# Patient Record
Sex: Female | Born: 1965 | Race: White | Hispanic: No | Marital: Married | Smoking: Former smoker
Health system: Southern US, Academic
[De-identification: ages and names within clinical notes are randomized; demographics above are authoritative.]

## PROBLEM LIST (undated history)

## (undated) DIAGNOSIS — E079 Disorder of thyroid, unspecified: Secondary | ICD-10-CM

## (undated) DIAGNOSIS — F411 Generalized anxiety disorder: Secondary | ICD-10-CM

## (undated) DIAGNOSIS — F32A Depression, unspecified: Secondary | ICD-10-CM

## (undated) DIAGNOSIS — Z8619 Personal history of other infectious and parasitic diseases: Secondary | ICD-10-CM

## (undated) HISTORY — PX: HX TONSILLECTOMY: SHX27

## (undated) HISTORY — DX: Depression, unspecified: F32.A

## (undated) HISTORY — DX: Generalized anxiety disorder: F41.1

## (undated) HISTORY — DX: Personal history of other infectious and parasitic diseases: Z86.19

## (undated) HISTORY — DX: Disorder of thyroid, unspecified: E07.9

---

## 1993-02-25 ENCOUNTER — Other Ambulatory Visit (HOSPITAL_COMMUNITY): Payer: Self-pay

## 2019-10-10 IMAGING — MG 3D SCREENING MAMMO BIL W/CAD
5 series · 9 of 24 positions shown · non-contrast
Comparison: 02/24/2020 and 06/17/2014.

------------- REPORT [id number] -------------
[facility]
[address]
Daina Ms.MOLIMA, SRT FERNANDA:
We wish to report the following on your recent mammography examination. We are sending a report to your referring physician or other health care provider. 
(       Normal/Negative:
No evidence of cancer.
This statement is mandated by the [place], Department of Health.
Your examination was performed by one of our technologists, who are registered radiological technologists and also specially certified in mammography:
___
Parlak, Edaly (M)
Nepomuceno, Martinez (M)

Your mammogram was interpreted by our radiologist.
( 
Sofeine Made, M.D.
(Annual Breast Examination by a physician or other health care provider
(Annual Mammography Screening beginning at age 40
(Monthly Breast Self Examination
------------- REPORT [id number] -------------
EXAM:  3D BILATERAL ANNUAL SCREENING DIGITAL MAMMOGRAM WITH TOMOSYNTHESIS AND CAD
INDICATION: Screening.

[R CC · right · 0.10mm/px · 2 of 2 slices shown]
[im 1/2]
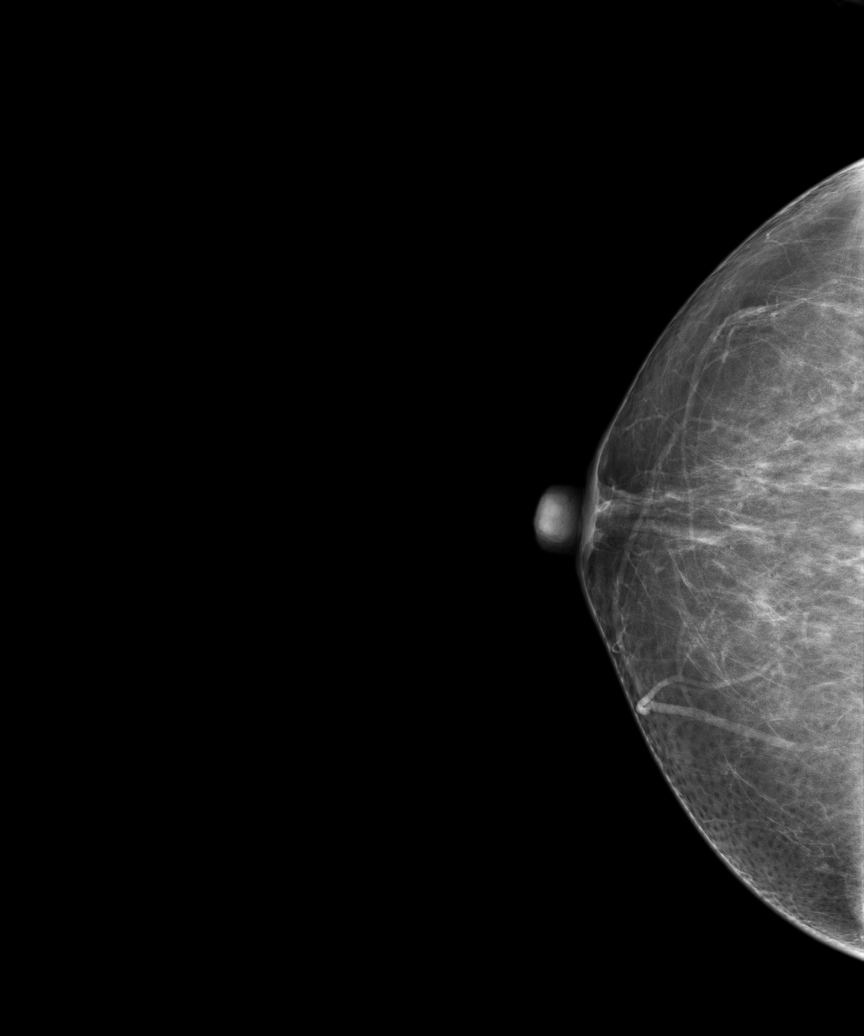
[im 2/2]
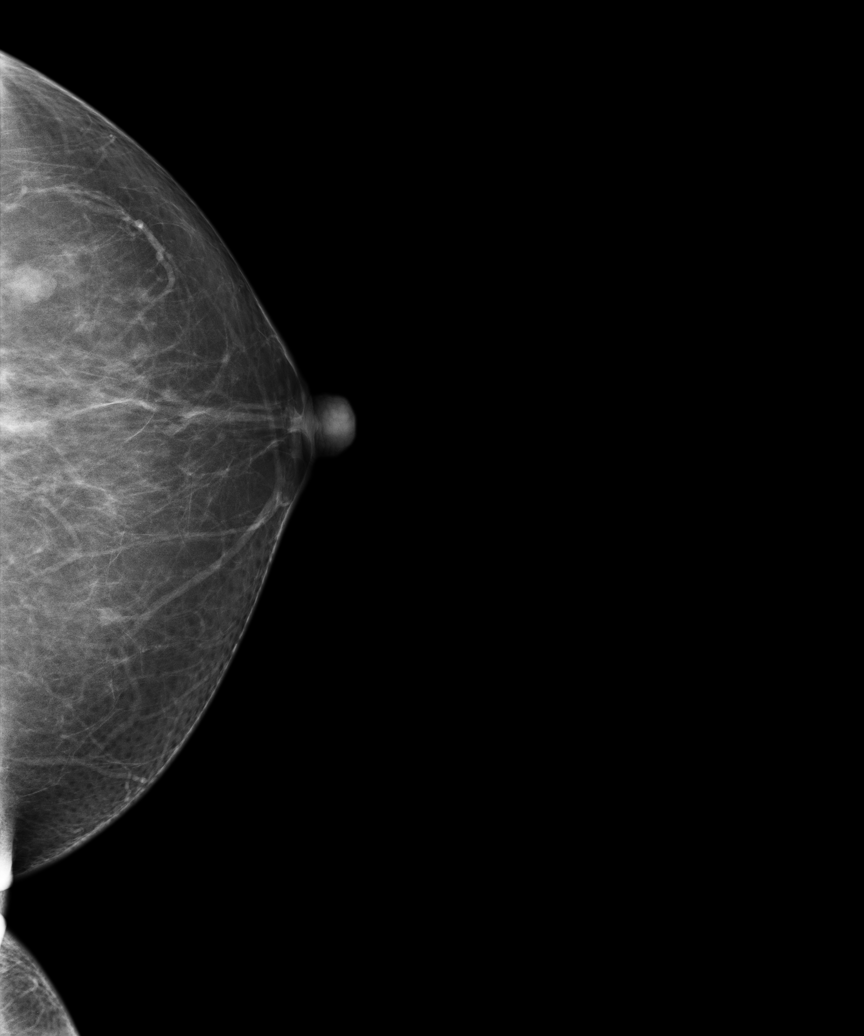

[3D SCREENING MAMMO BIL W/CAD · 2 acquisitions, 4 frames shown (1 of 2)]
[im 1/2]
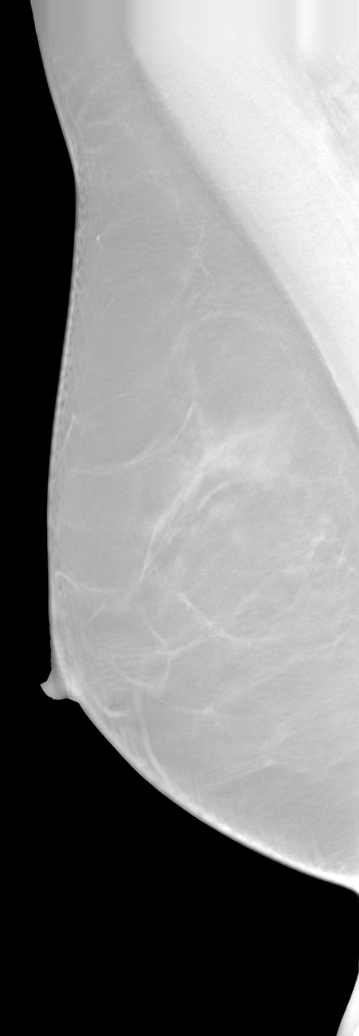
[im 1/2]
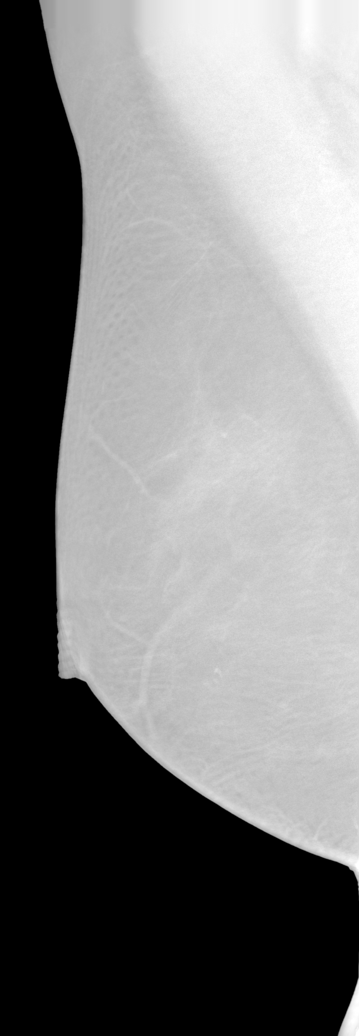
[im 2/2]
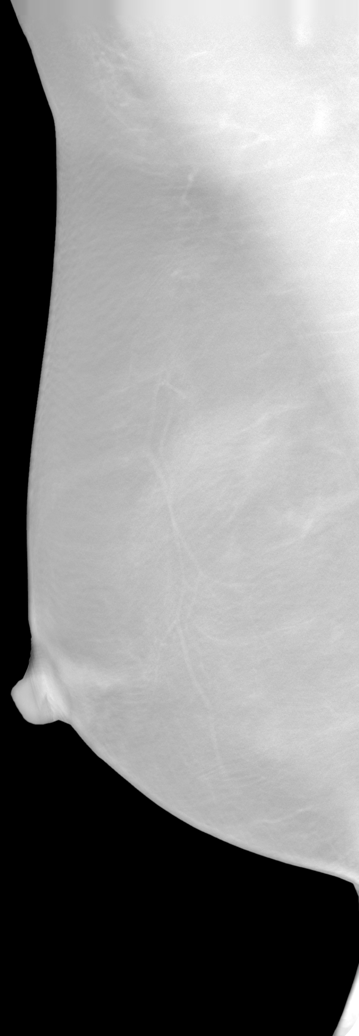
[im 2/2]
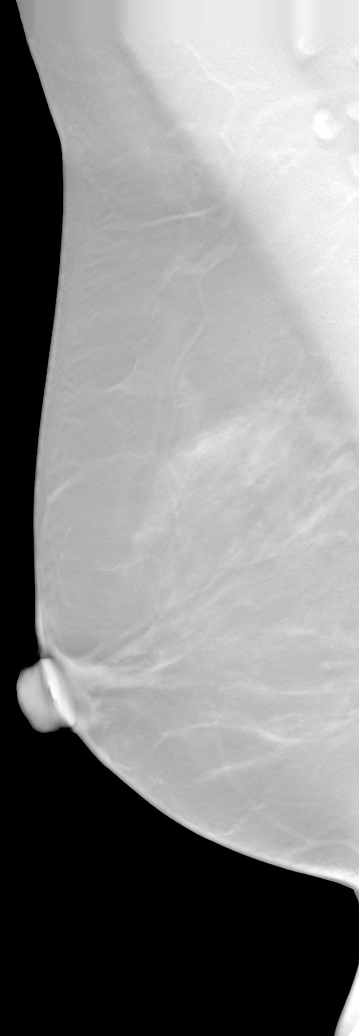

[3D SCREENING MAMMO BIL W/CAD (2 of 2) · tomo slice 13/80.0]
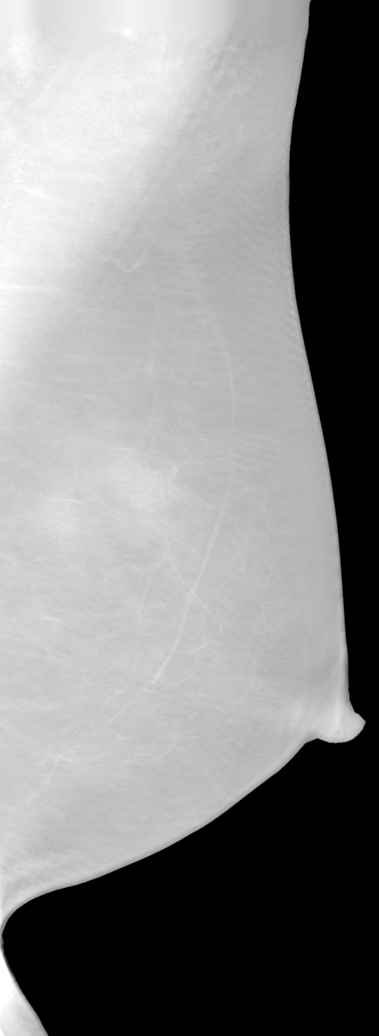

[R]
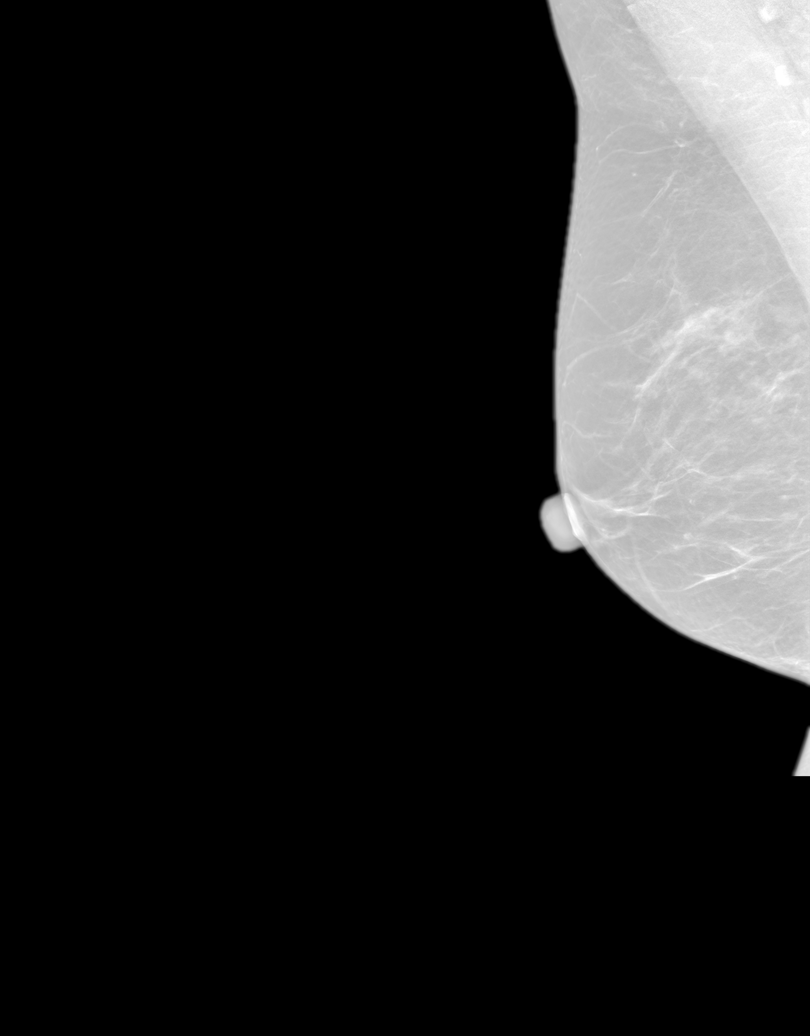

[L]
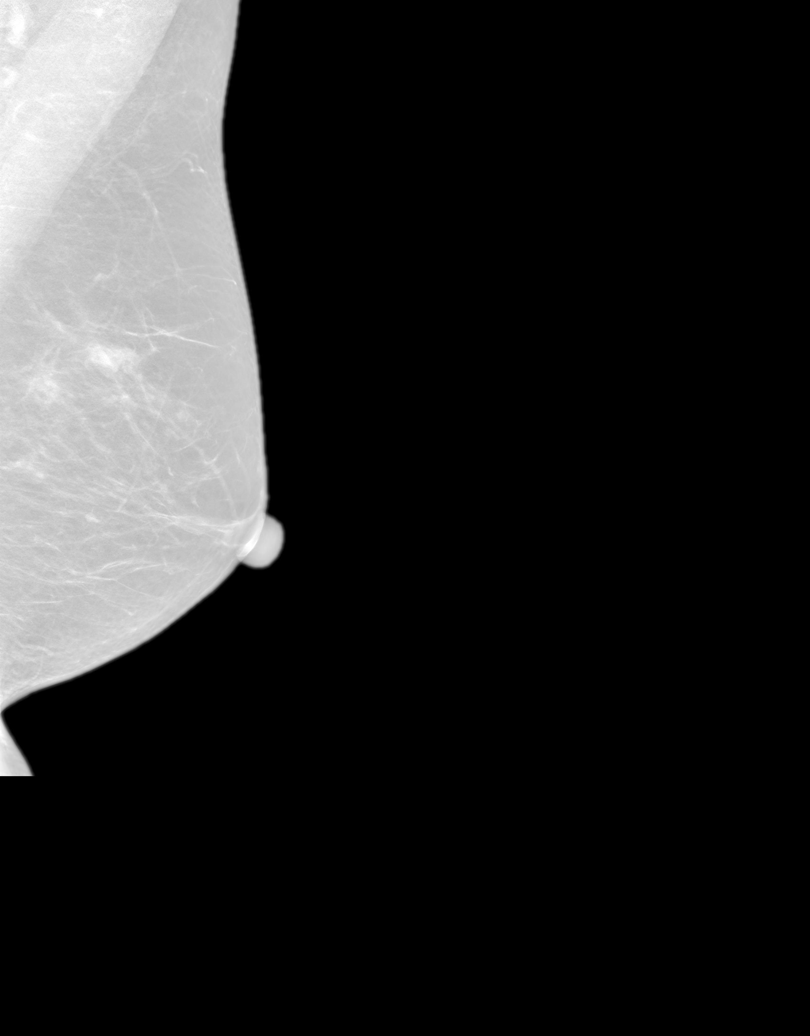

[9 of 24 positions shown; findings below may reference images not displayed]

FINDINGS: There are scattered fibroglandular elements.  There is no mass or suspicious cluster of microcalcifications.   There is no architectural distortion, skin thickening or nipple retraction.
IMPRESSION: 1.  BIRADS 2-Benign findings. Patient has been added in a reminder system with a target date for the next screening mammography.

2.  DENSITY CODE –  B (Scattered areas of fibroglandular density).

Final Assessment Code:

Bi-Rads 2 

BI-RADS 0
Need additional imaging evaluation

BI-RADS 1
Negative mammogram

BI-RADS 2
Benign finding

BI-RADS 3
Probably benign finding; short-interval follow-up suggested

BI-RADS 4
Suspicious abnormality; biopsy should be considered

BI-RADS 5
Highly suggestive of malignancy; appropriate action should be taken

BI-RADS 6
Known biopsy-proven malignancy; appropriate action should be taken

NOTE:
In compliance with Federal regulations, the results of this mammogram are being sent to the patient.

## 2020-05-30 IMAGING — CT CT LUMBAR SPINE WITHOUT CONTRAST
3 of 8 series · 15 of 33 positions shown, 18 images · non-contrast
Comparison: None available.

EXAM:  CT LUMBAR SPINE WITHOUT CONTRAST
INDICATION: Right-sided lower back pain.
TECHNIQUE: Axial noncontrast CT imaging of the lumbar spine was performed. Images were reviewed in multiple windows and projections. Exam was performed using 1 or more of the following dose reduction techniques: Automated exposure control, adjustment of the mA and/or kV according to patient size, or the use of iterative reconstruction technique.

[ax bone · axial · 0.31mm/px · z∈[-1089,-891]mm · 7 of 133 slices shown, 9 images]
[im 17/133  soft-tissue]
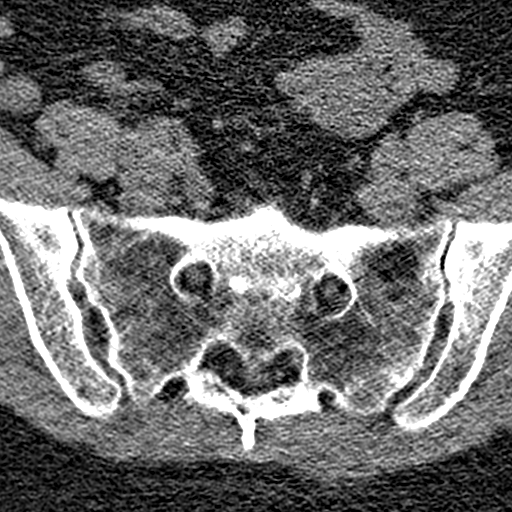
[im 17/133  bone]
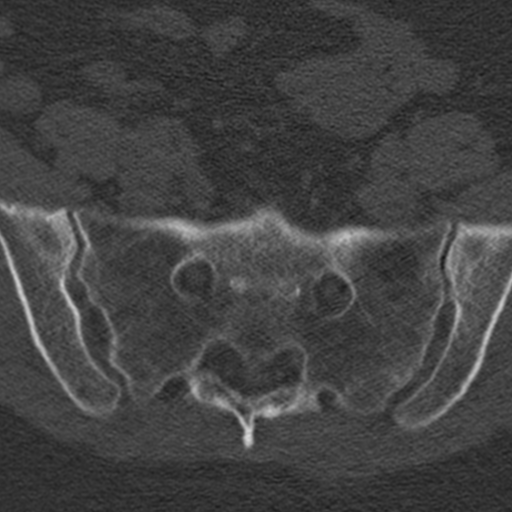
[im 34/133  bone]
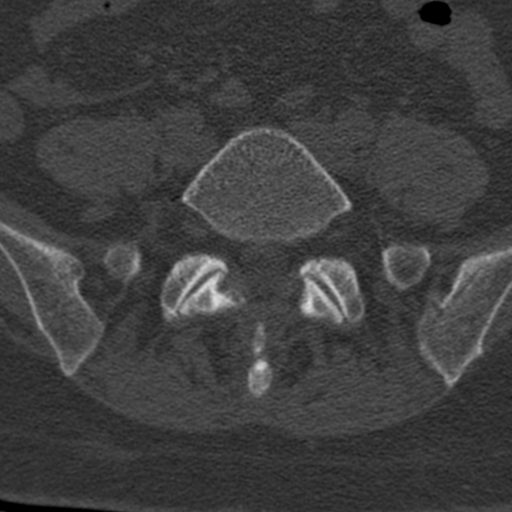
[im 50/133  bone]
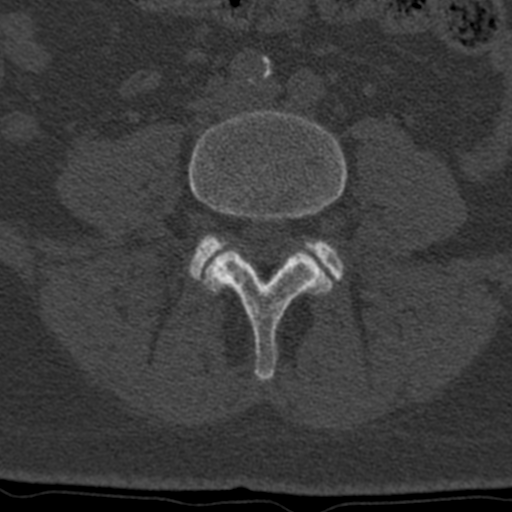
[im 67/133  bone]
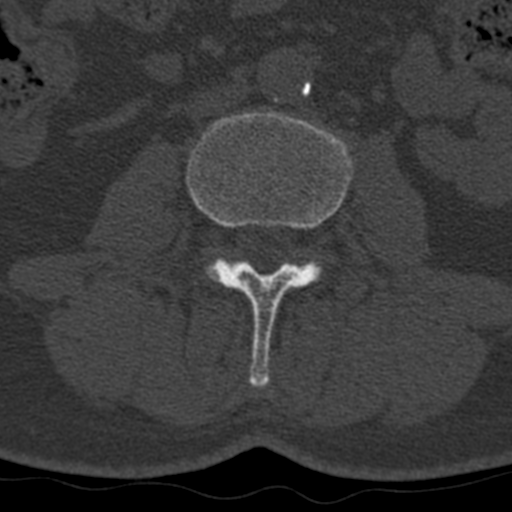
[im 83/133  soft-tissue]
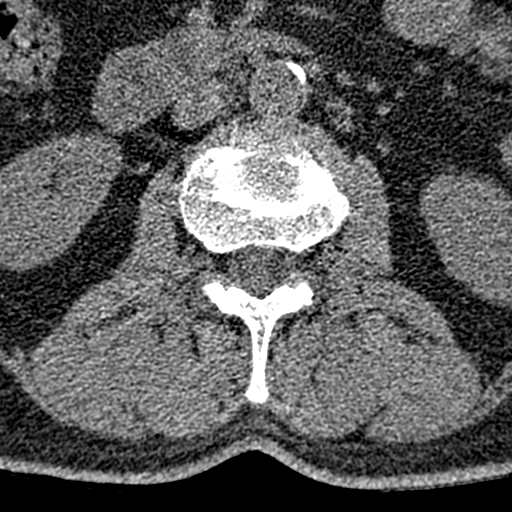
[im 83/133  bone]
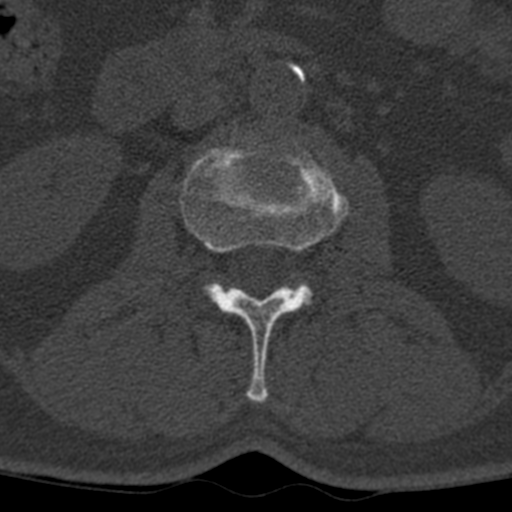
[im 100/133  bone]
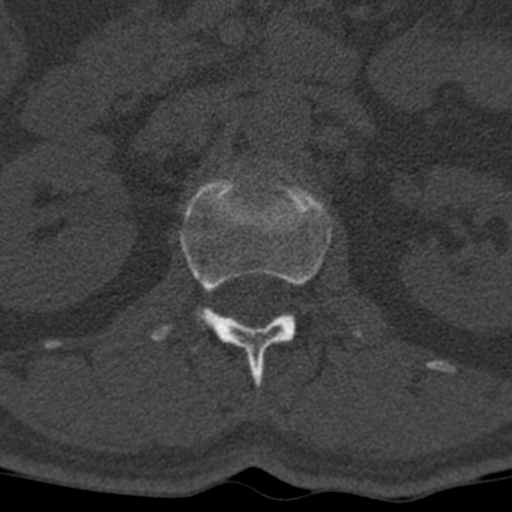
[im 116/133  bone]
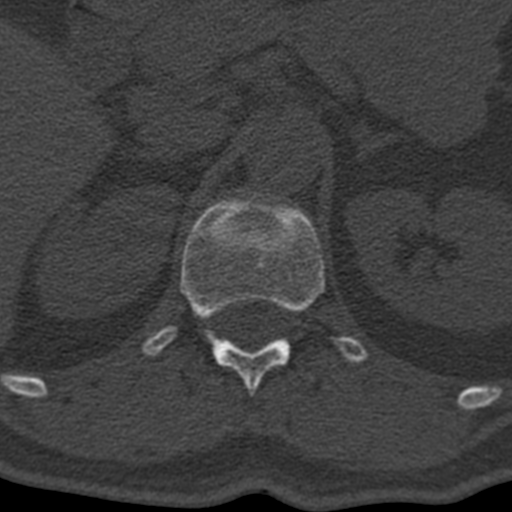

[sag bone · sagittal · 0.45mm/px · 5 of 46 slices shown, 6 images]
[im 16/46  bone]
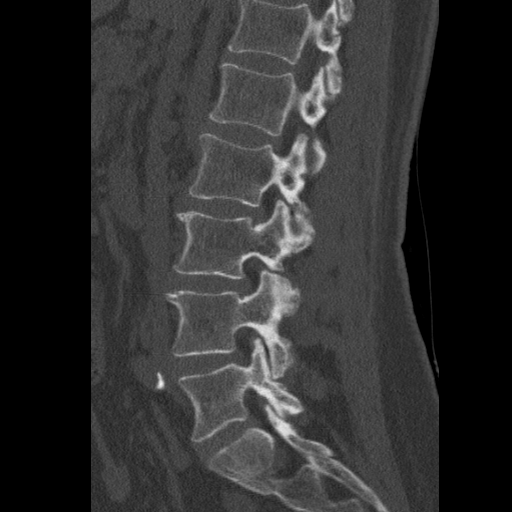
[im 19/46  bone]
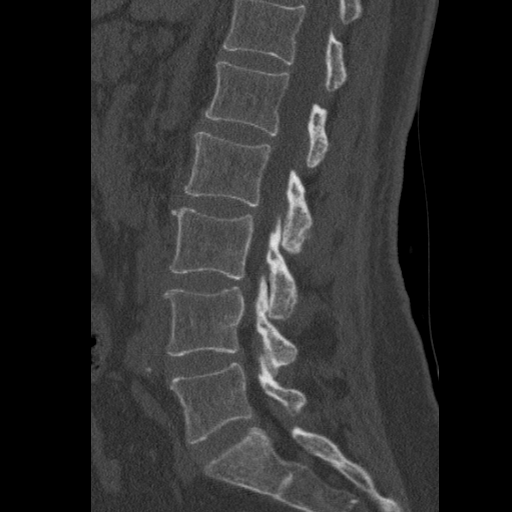
[im 23/46  soft-tissue]
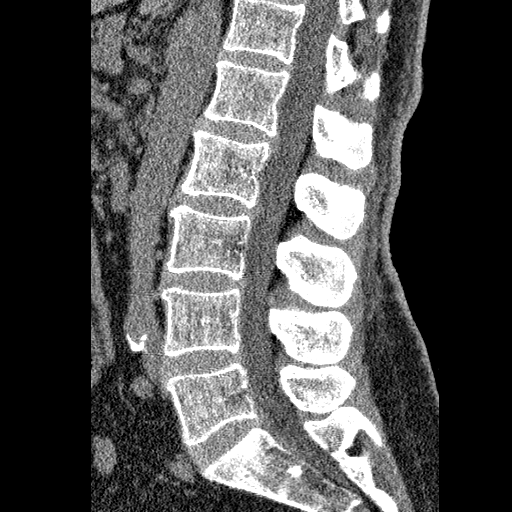
[im 23/46  bone]
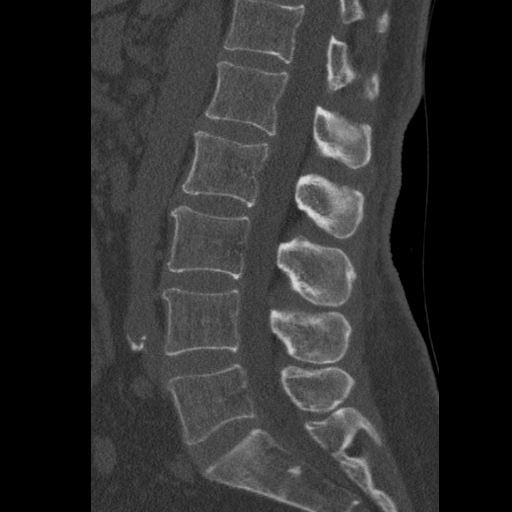
[im 27/46  bone]
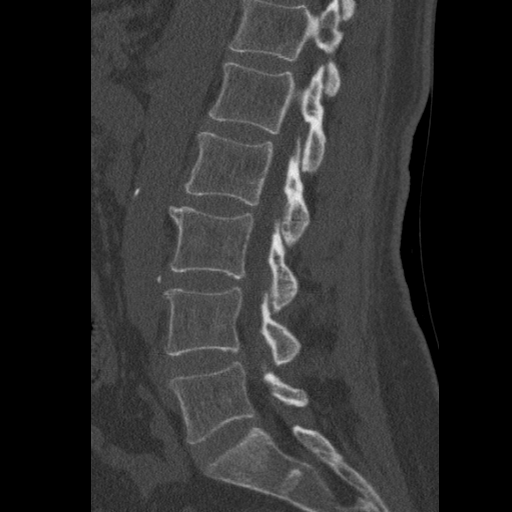
[im 31/46  bone]
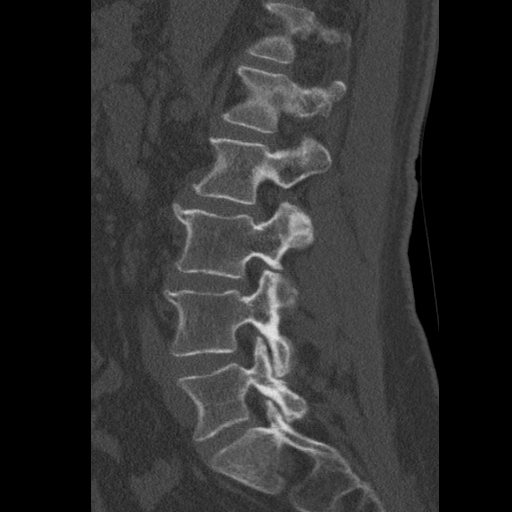

[cor bone · coronal · 0.45mm/px · 3 of 61 slices shown]
[im 13/61  bone]
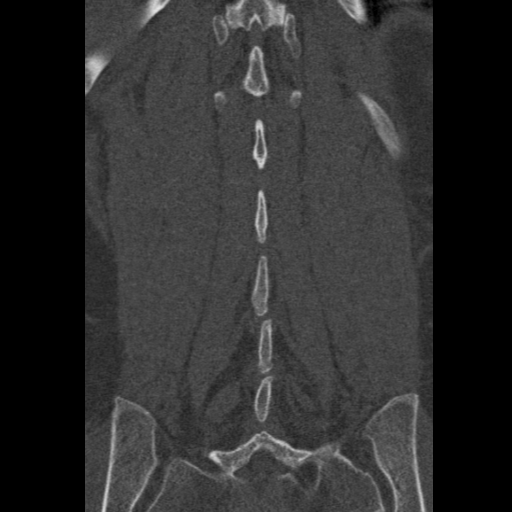
[im 25/61  bone]
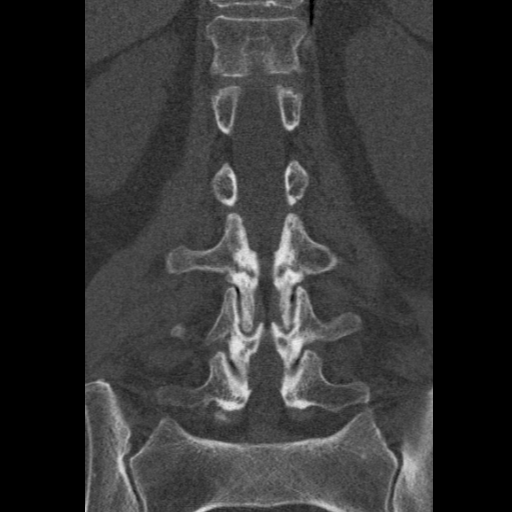
[im 37/61  bone]
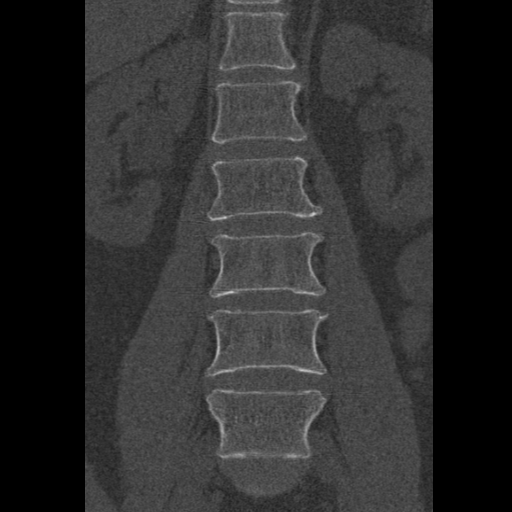

[15 of 33 positions shown; findings below may reference images not displayed]

FINDINGS: Vertebral bodies are normal in height and alignment. There is no acute fracture or subluxation. Disc spaces are also fairly well maintained at all levels.

At L1-2 level, there is a small broad-based central disc bulge, mildly effacing the ventral thecal sac. There is no significant neural foraminal stenosis.

At L2-3 level, there is a small broad-based central disc bulge, mildly effacing the ventral thecal sac. There is no significant neural foraminal stenosis.

At L3-4 level, there is a minimal bulging annulus, minimally effacing the ventral thecal sac. There is mild bilateral neural foraminal stenosis from bulging annulus without nerve root impingement.

At L4-5 level, there is a small broad-based central disc bulge with associated ligamentous hypertrophy resulting in mild-to-moderate spinal stenosis. There is mild bilateral neural foraminal stenosis from facet arthropathy and bulging annulus without nerve root impingement.

At L5-S1 level, there is a small broad-based central disc bulge, minimally abutting the ventral thecal sac. There is no significant neural foraminal stenosis.

Paraspinal soft tissues are unremarkable. There are scattered vascular calcifications.
IMPRESSION: Small disc bulges at most levels with mild-to-moderate spinal stenosis at L4-5 level. 

Multilevel neural foraminal stenosis as detailed above.

## 2020-12-25 IMAGING — MG 3D SCREENING MAMMO BIL W/CAD & TOMO
5 series · 9 of 24 positions shown · non-contrast
Comparison: 02/06/2021

------------- REPORT [id number] -------------
﻿

                         #:
EXAM:  3D BILATERAL ANNUAL SCREENING DIGITAL MAMMOGRAM WITH CAD AND TOMOSYNTHESIS
INDICATION: Screening.

[L]
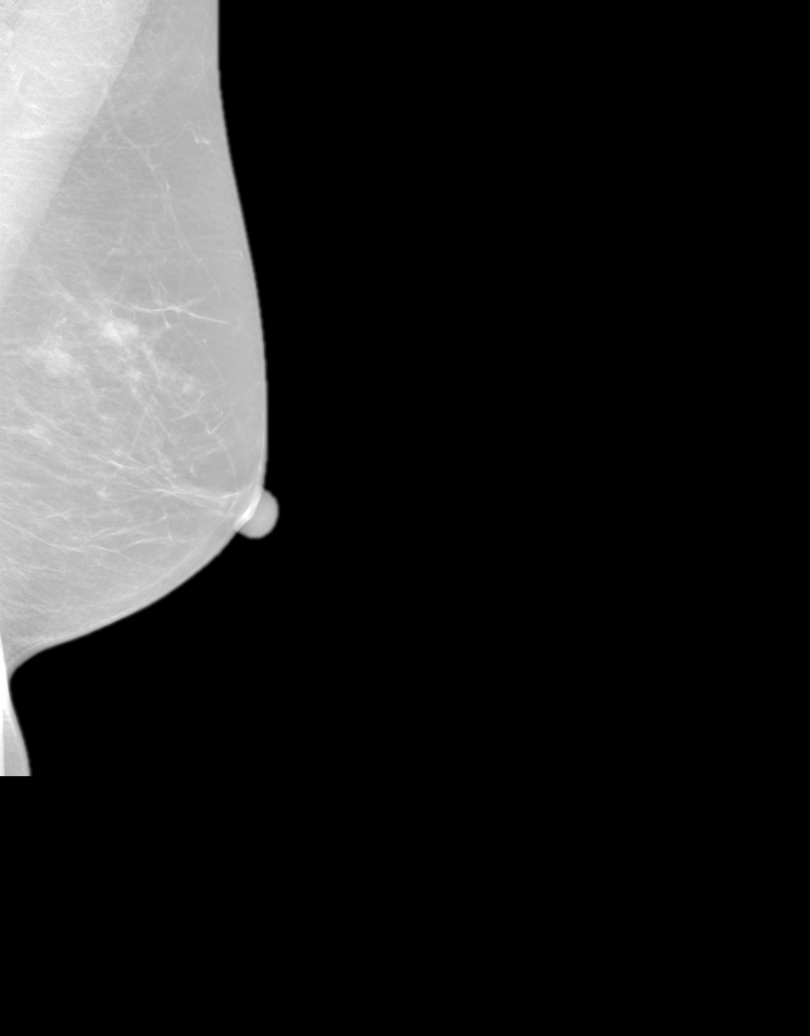

[R]
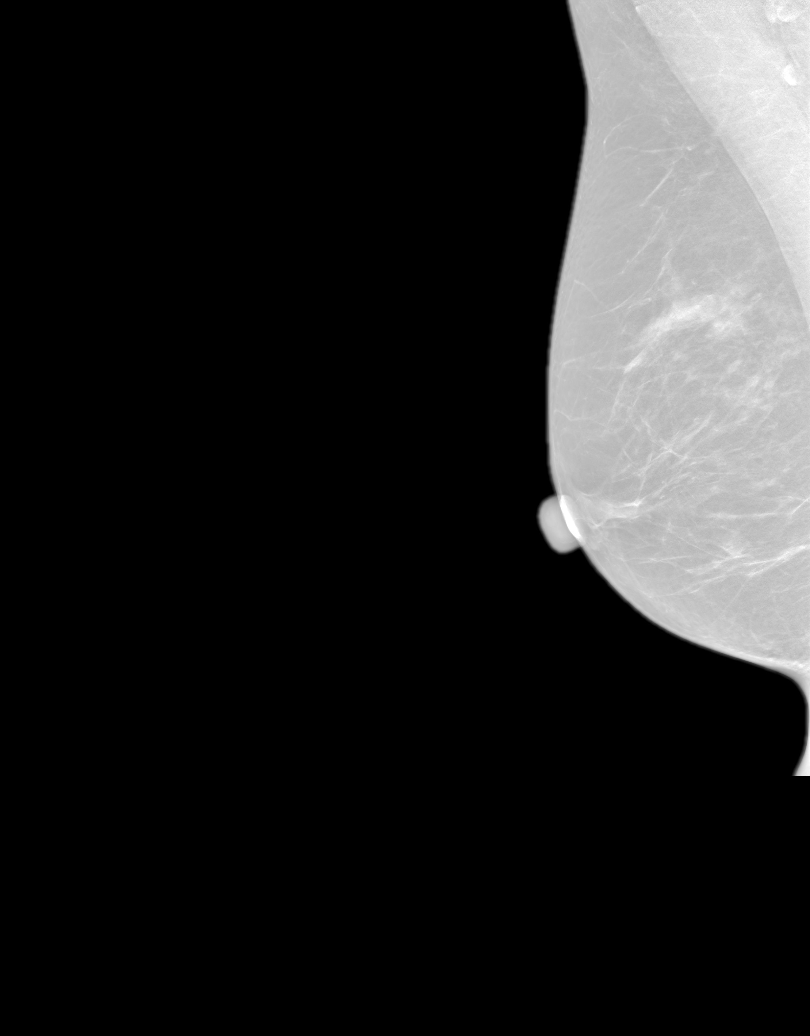

[Series 2921: R CC tomo · right · 2 of 3 slices shown]
[im 1/3]
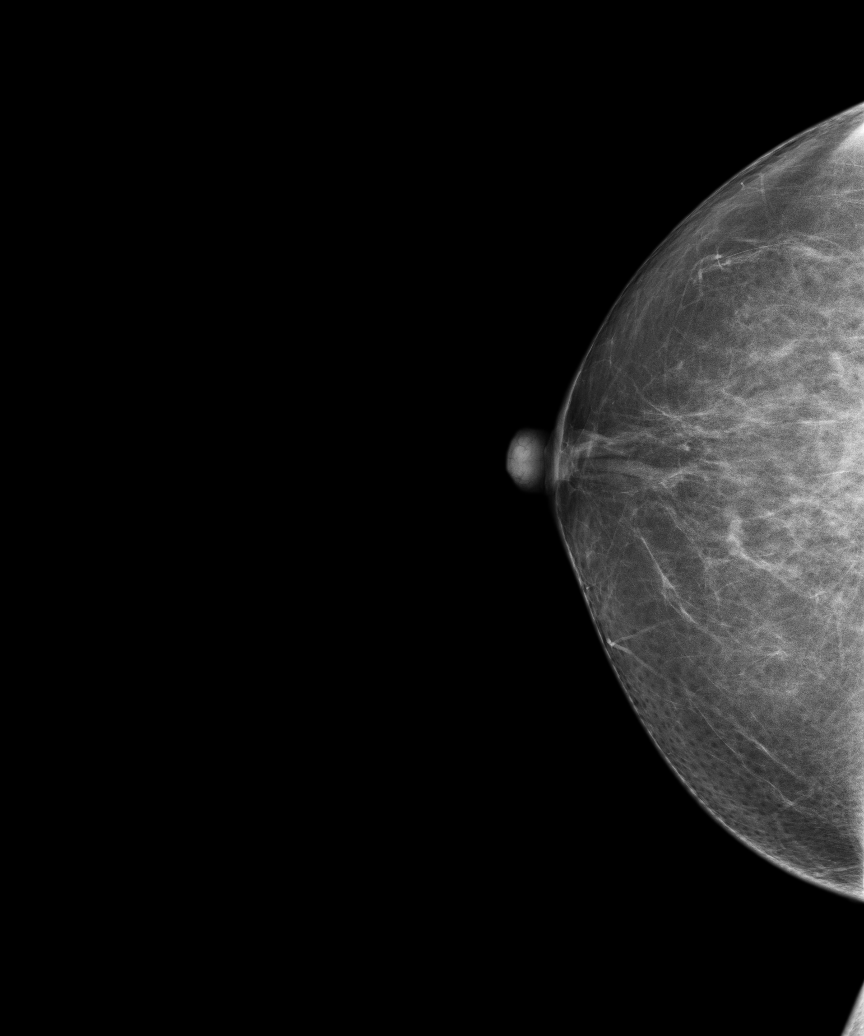
[im 3/3]
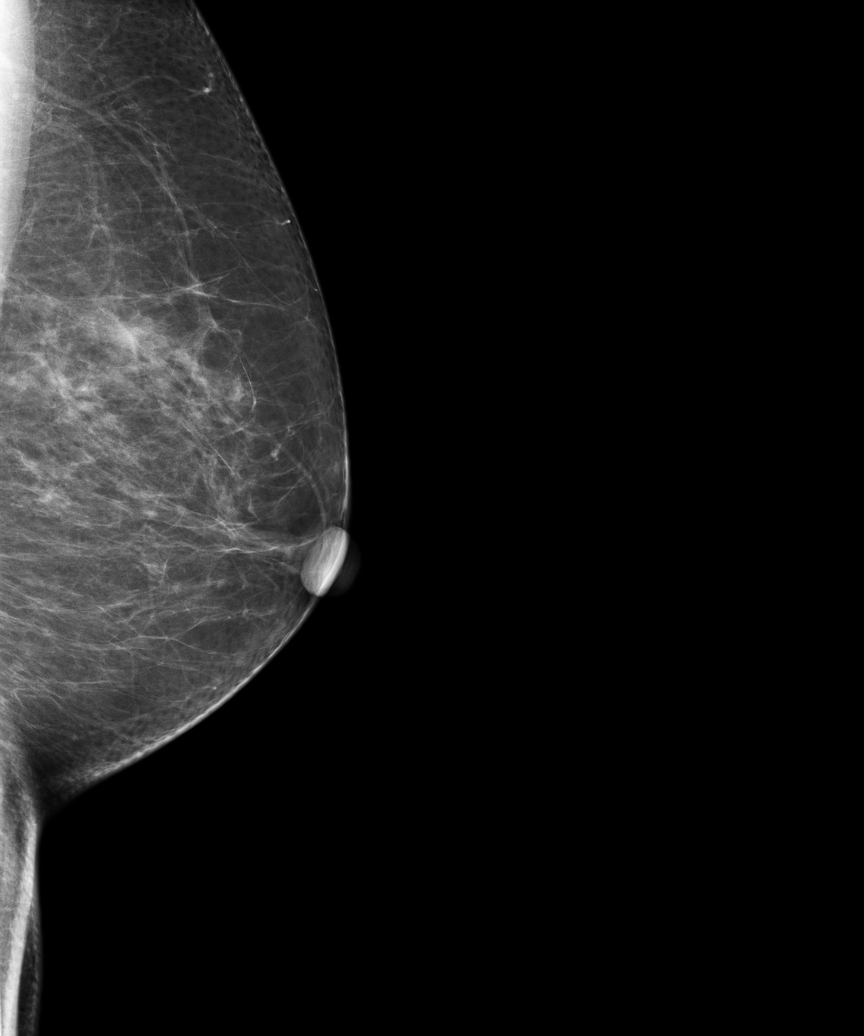

[Series 2923: 3D SCREENING MAMMO BIL W/CAD & TOMO tomo · 2 acquisitions, 4 frames shown (1 of 2)]
[im 1/2]
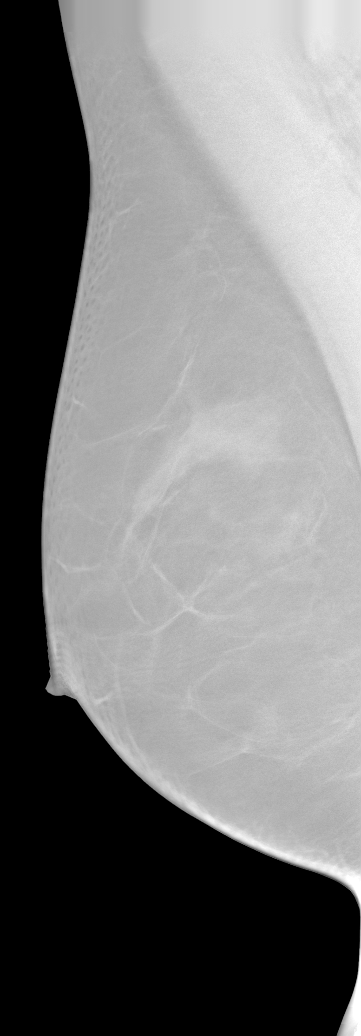
[im 1/2]
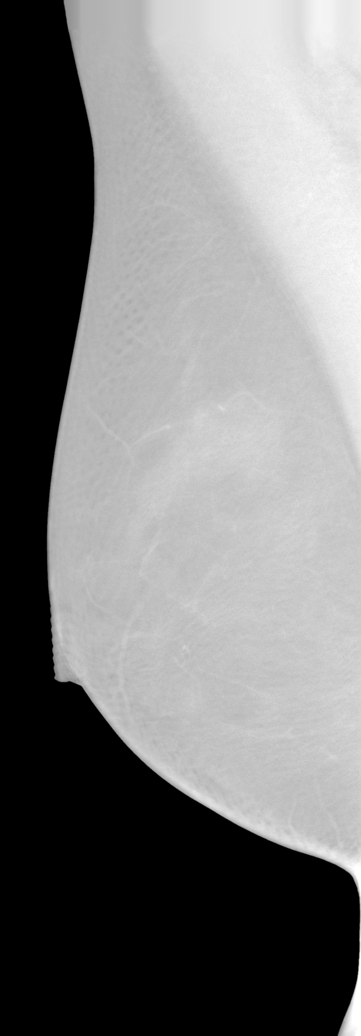
[im 2/2]
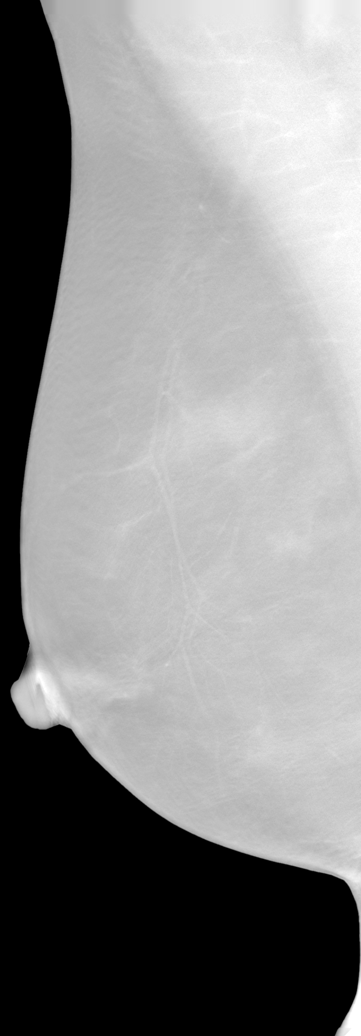
[im 2/2]
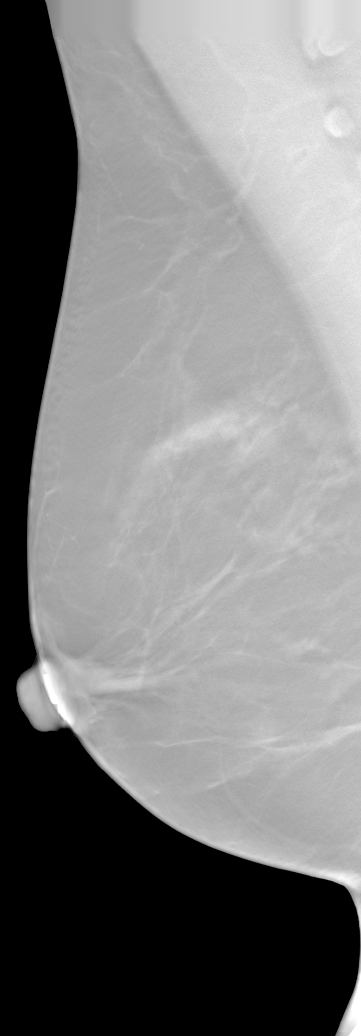

[3D SCREENING MAMMO BIL W/CAD & TOMO tomo (2 of 2) · tomo slice 13/80.0]
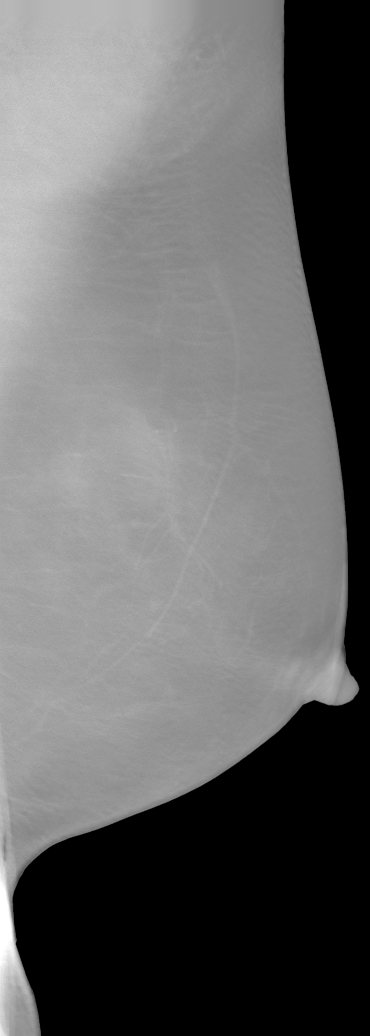

[9 of 24 positions shown; findings below may reference images not displayed]

FINDINGS: There are scattered fibroglandular elements.  There is no mass or suspicious cluster of microcalcifications.   There is no architectural distortion, skin thickening or nipple retraction.
IMPRESSION: 1.  BIRADS 2-Benign findings. Patient has been added in a reminder system with a target date for the next screening mammography.

2.  DENSITY CODE – B (Scattered areas of fibroglandular density). 

Final Assessment Code:

Bi-Rads 2 

BI-RADS 0
Need additional imaging evaluation.

BI-RADS 1
Negative mammogram.

BI-RADS 2
Benign finding.

BI-RADS 3
Probably benign finding; short-interval follow-up suggested.

BI-RADS 4
Suspicious abnormality; biopsy should be considered.

BI-RADS 5
Highly suggestive of malignancy; appropriate action should be taken.

BI-RADS 6
Known biopsy-proven malignancy; appropriate action should be taken.

NOTE:
In compliance with Federal regulations, the results of this mammogram are being sent to the patient.

------------- REPORT [id number] -------------
[facility]
[address]
Tiger Ms.MADU, TAWANG:
We wish to report the following on your recent mammography examination. We are sending a report to your referring physician or other health care provider. 
(       Normal/Negative:
No evidence of cancer.
This statement is mandated by the [place], Department of Health.
Your examination was performed by one of our technologists, who are registered radiological technologists and also specially certified in mammography:
___
Markland, Marjuan (M)

Your mammogram was interpreted by our radiologist.

( 
Collette Sedman, M.D.

(Annual Breast Examination by a physician or other health care provider
(Annual Mammography Screening beginning at age 40
(Monthly Breast Self Examination

## 2022-01-01 IMAGING — MG 3D SCREENING MAMMO BIL W/CAD & TOMO
5 series · 8 of 24 positions shown · non-contrast
Comparison: 04/16/2021 and 01/30/2020.

------------- REPORT [id number] -------------
[facility]
[address]
Tiger Ms.MADU, TAWANG:
We wish to report the following on your recent mammography examination. We are sending a report to your referring physician or other health care provider. 
(       Normal/Negative:
No evidence of cancer.
This statement is mandated by the [place], Department of Health.
Your examination was performed by one of our technologists, who are registered radiological technologists and also specially certified in mammography:
___
Markland, Marjuan (M)

Your mammogram was interpreted by our radiologist.
( 
Collette Sedman, M.D.
(Annual Breast Examination by a physician or other health care provider
(Annual Mammography Screening beginning at age 40
(Monthly Breast Self Examination
------------- REPORT [id number] -------------
﻿
EXAM:  3D BILATERAL ANNUAL SCREENING DIGITAL MAMMOGRAM WITH CAD AND TOMOSYNTHESIS
INDICATION: Screening.

[R]
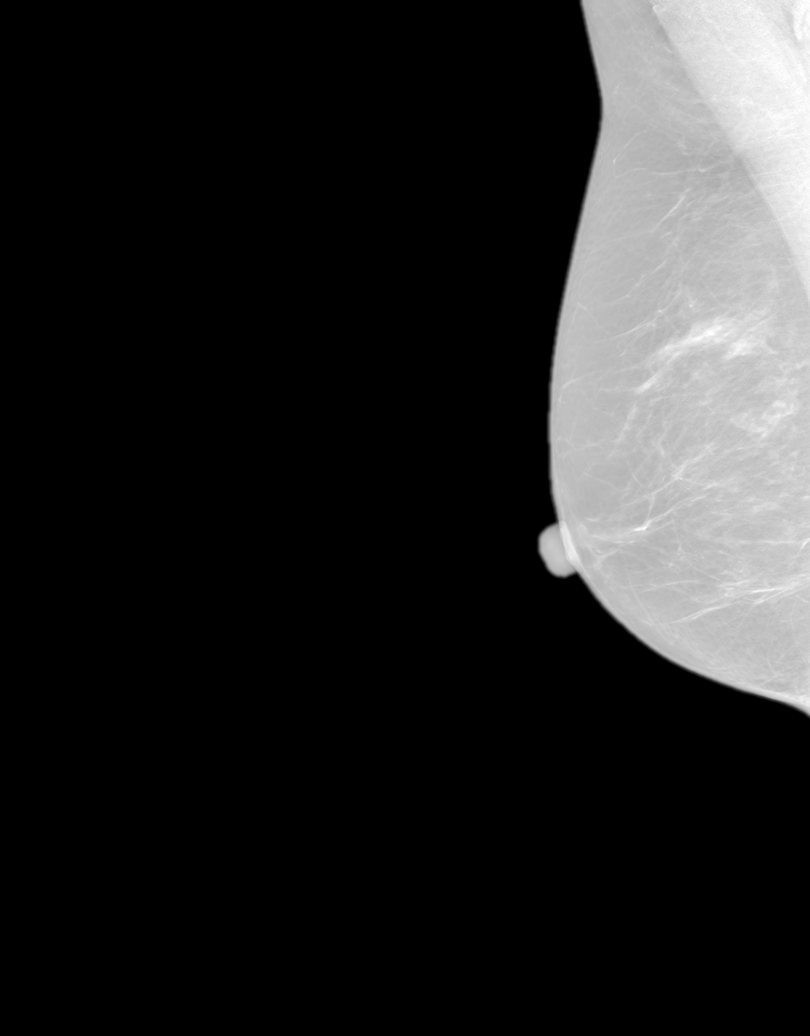

[L]
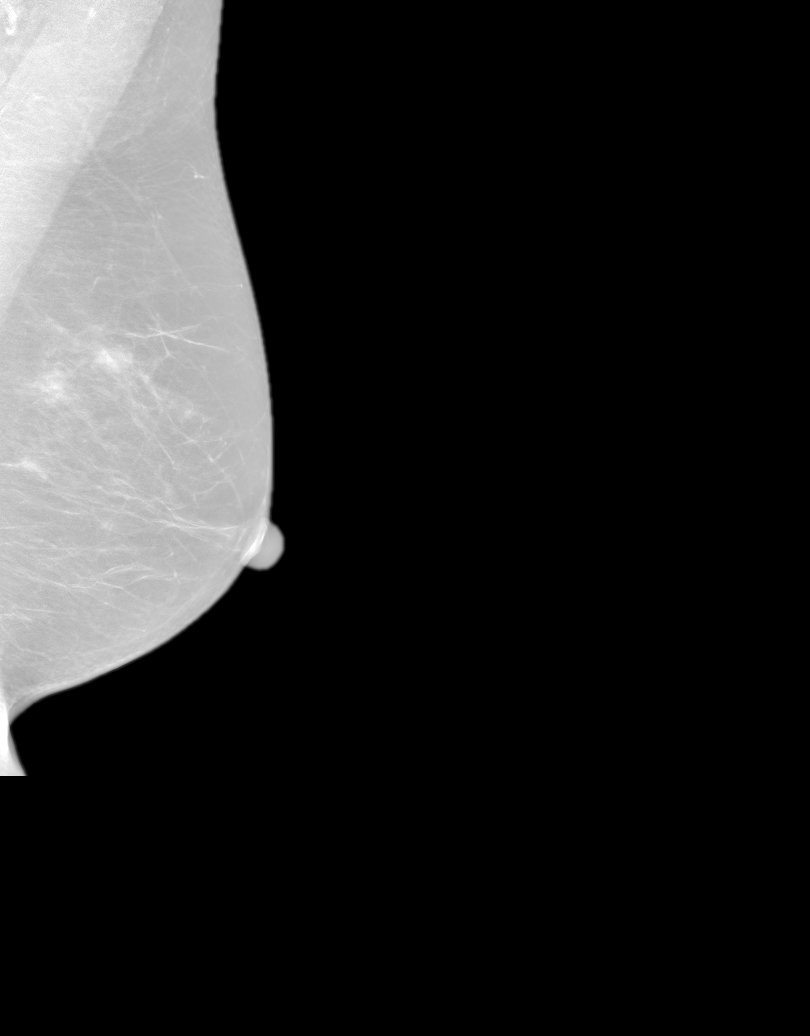

[R CC tomo · right · 0.10mm/px · 2 of 2 slices shown]
[im 1/2]
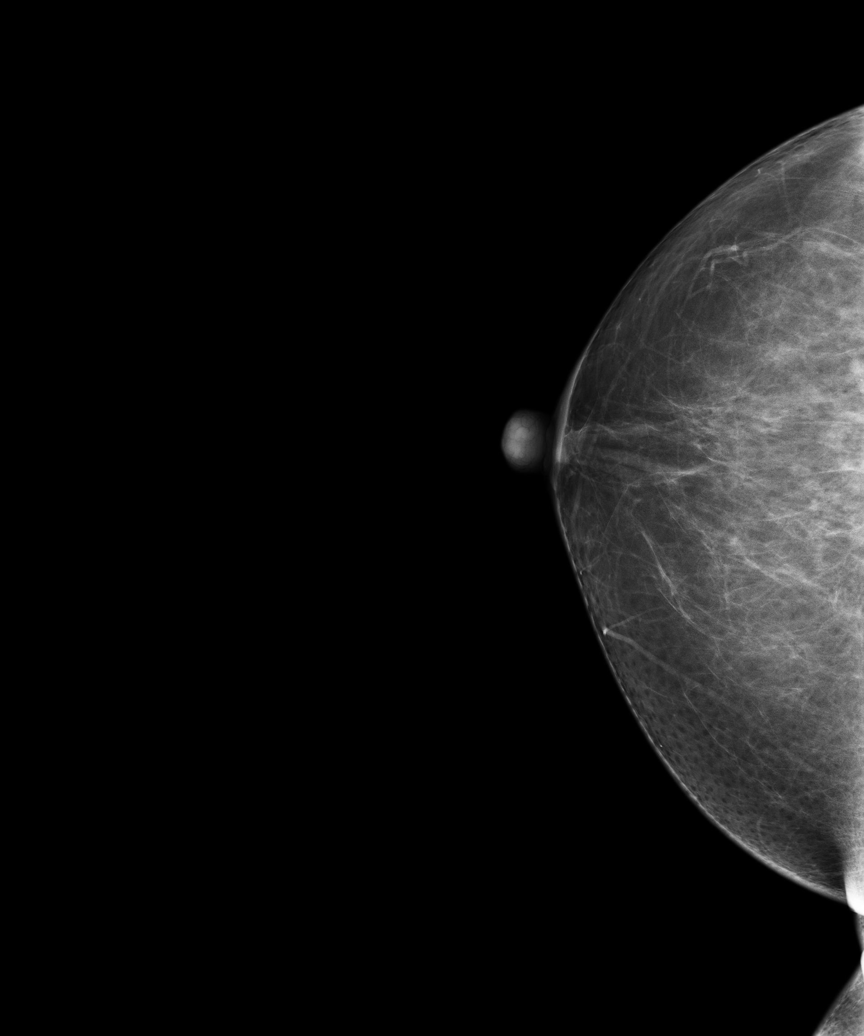
[im 2/2]
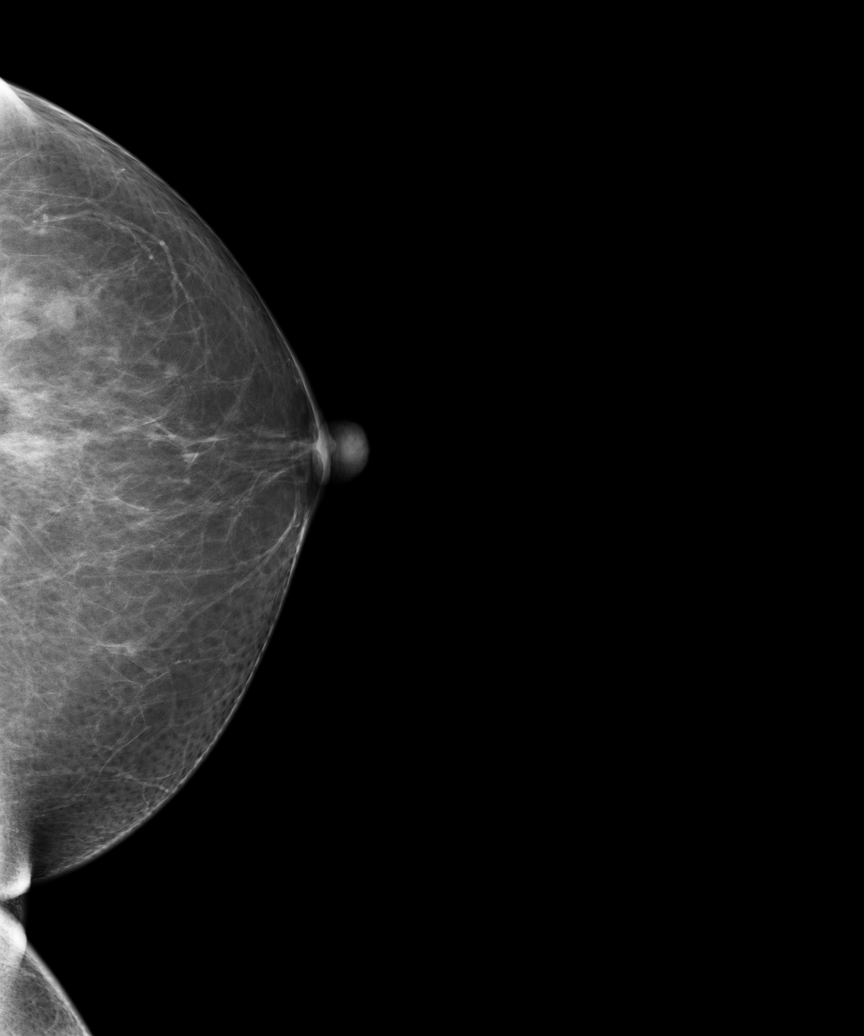

[3D SCREENING MAMMO BIL W/CAD & TOMO tomo · 2 acquisitions, 3 frames shown (1 of 2)]
[im 1/2]
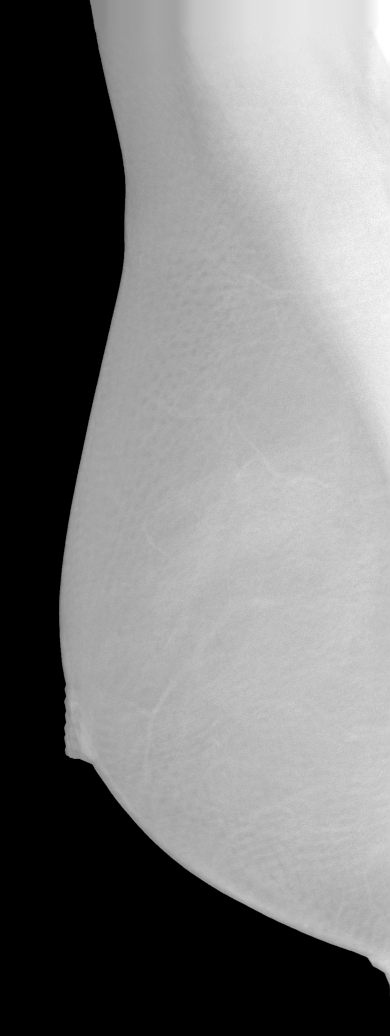
[im 2/2]
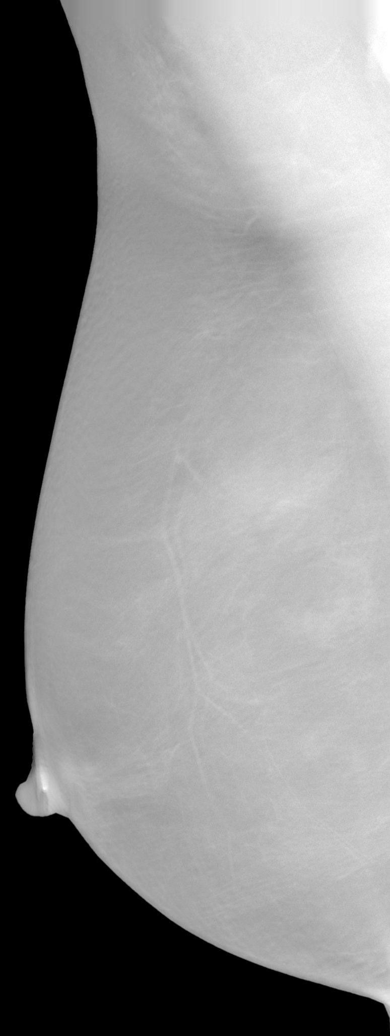
[im 2/2]
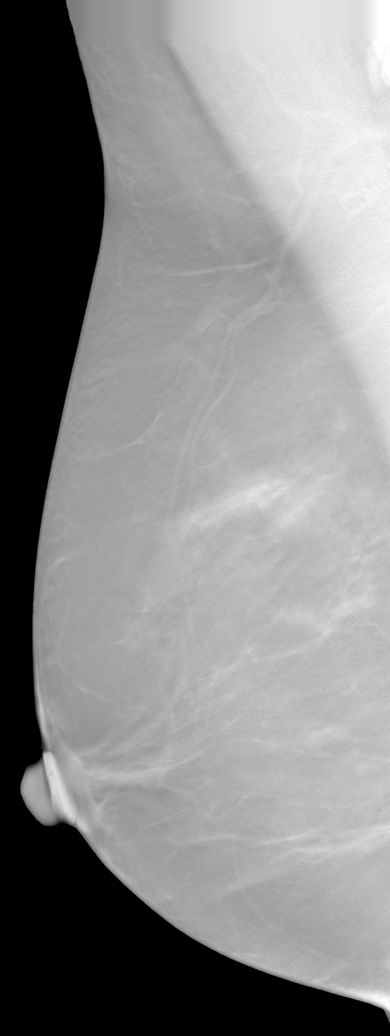

[3D SCREENING MAMMO BIL W/CAD & TOMO tomo (2 of 2) · tomo slice 13/83.0]
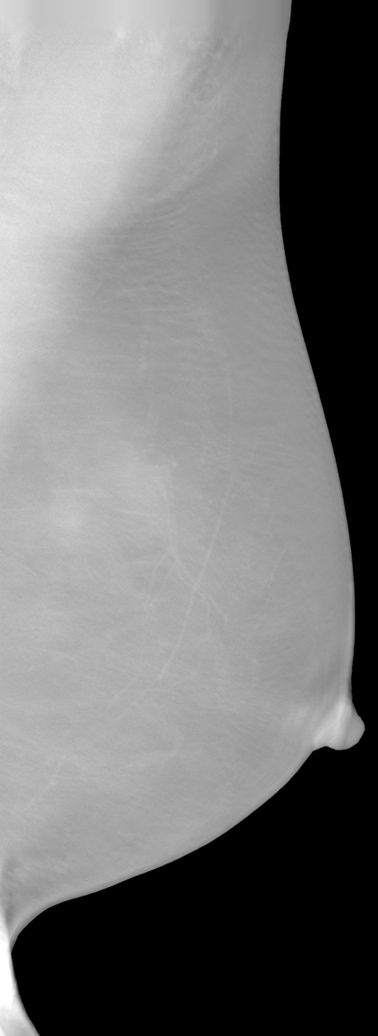

[8 of 24 positions shown; findings below may reference images not displayed]

FINDINGS: There are scattered fibroglandular elements.  There is no mass or suspicious cluster of microcalcifications.   There is no architectural distortion, skin thickening or nipple retraction.
IMPRESSION: 1.  BIRADS 2-Benign findings. Patient has been added in a reminder system with a target date for the next screening mammography.

2.  DENSITY CODE – B (Scattered areas of fibroglandular density). 

Final Assessment Code:

Bi-Rads 2 

BI-RADS 0
 Need additional imaging evaluation.

BI-RADS 1
 Negative mammogram.

BI-RADS 2
 Benign finding.

BI-RADS 3
 Probably benign finding; short-interval follow-up suggested.

BI-RADS 4
 Suspicious abnormality; biopsy should be considered.

BI-RADS 5
 Highly suggestive of malignancy; appropriate action should be taken.

BI-RADS 6
 Known biopsy-proven malignancy; appropriate action should be taken.

NOTE:
In compliance with Federal regulations, the results of this mammogram are being sent to the patient.

## 2022-04-29 ENCOUNTER — Ambulatory Visit (INDEPENDENT_AMBULATORY_CARE_PROVIDER_SITE_OTHER): Payer: Self-pay | Admitting: OBSTETRICS/GYNECOLOGY

## 2022-07-09 ENCOUNTER — Encounter (INDEPENDENT_AMBULATORY_CARE_PROVIDER_SITE_OTHER): Payer: Self-pay | Admitting: OBSTETRICS/GYNECOLOGY

## 2022-07-09 ENCOUNTER — Ambulatory Visit (INDEPENDENT_AMBULATORY_CARE_PROVIDER_SITE_OTHER): Payer: BC Managed Care – PPO | Admitting: OBSTETRICS/GYNECOLOGY

## 2022-07-09 ENCOUNTER — Other Ambulatory Visit: Payer: Self-pay

## 2022-07-09 ENCOUNTER — Other Ambulatory Visit: Payer: BC Managed Care – PPO | Attending: OBSTETRICS/GYNECOLOGY | Admitting: OBSTETRICS/GYNECOLOGY

## 2022-07-09 VITALS — BP 162/86 | HR 80 | Ht 66.0 in | Wt 169.0 lb

## 2022-07-09 DIAGNOSIS — Z01419 Encounter for gynecological examination (general) (routine) without abnormal findings: Secondary | ICD-10-CM

## 2022-07-09 DIAGNOSIS — Z79890 Hormone replacement therapy (postmenopausal): Secondary | ICD-10-CM

## 2022-07-09 MED ORDER — PROGESTERONE MICRONIZED 100 MG CAPSULE
100.0000 mg | ORAL_CAPSULE | Freq: Every day | ORAL | 4 refills | Status: AC
Start: 2022-07-09 — End: ?

## 2022-07-09 MED ORDER — ESTRADIOL 1 MG TABLET
1.0000 mg | ORAL_TABLET | Freq: Every day | ORAL | 4 refills | Status: DC
Start: 2022-07-09 — End: 2023-08-11

## 2022-07-09 NOTE — Nursing Note (Signed)
Here for annual exam. No problems.    Mary Rosario, CMA

## 2022-07-09 NOTE — H&P (Signed)
[facility]  [address]  [place] [address]    Annual Exam    Name: Mary Rosario MRN:  [id number]   Date: 07/09/2022 Age: 56 y.o.     Reason for Visit:  Chief Complaint   Patient presents with   . Annual Exam       History of Present Illness:   Mary Rosario is a 56 y.o. female here for annual well woman exam.    Change in sexual partner: No  Abnormal pap history:  No  Last pap:      STD history:  none known  HRT use:  No  Last Mammogram:  No results found for this or any previous visit (from the past 54008 hour(s)).    Last DXA:  No results found for this or any previous visit (from the past [id number] hour(s)).  Menses:  Cycles one time per month  Contraception:  oral progesterone-only contraceptive    Patient Active Problem List    Diagnosis Date Noted   . Encounter for well woman exam with routine gynecological exam 07/09/2022   . Hormone replacement therapy 07/09/2022       Patient History:  Past Medical History:   Diagnosis Date   . Depression    . Disorder of thyroid    . Generalized anxiety disorder    . History of chicken pox           Past Surgical History:   Procedure Laterality Date   . HX TONSILLECTOMY            Family Medical History:     Problem Relation (Age of Onset)    Alzheimer's/Dementia General Family Hx    Diabetes General Family Hx    Heart Disease General Family Hx    Stroke General Family Hx          Social History     Socioeconomic History   . Marital status: Married   . Number of children: 0   Tobacco Use   . Smoking status: Former     Years: 9.00     Types: Cigarettes   . Smokeless tobacco: Never   Vaping Use   . Vaping Use: Never used   Substance and Sexual Activity   . Alcohol use: Never   . Drug use: Never   . Sexual activity: Yes     Partners: Male      Current Outpatient Medications   Medication Sig   . DULoxetine (CYMBALTA DR) 60 mg Oral Capsule, Delayed Release(E.C.)    . estradioL (ESTRACE) 1 mg Oral Tablet Take 1 Tablet (1 mg total) by mouth Once a day    . gabapentin (NEURONTIN) 300 mg Oral Capsule    . Ibuprofen (MOTRIN) 800 mg Oral Tablet    . levothyroxine (SYNTHROID) 112 mcg Oral Tablet    . metFORMIN (GLUCOPHAGE XR) 500 mg Oral Tablet Sustained Release 24 hr    . phentermine (ADIPEX-P) 37.5 mg Oral Tablet    . progesterone micronized (PROMETRIUM) 100 mg Oral Capsule Take 1 Capsule (100 mg total) by mouth Once a day   . rosuvastatin (CRESTOR) 10 mg Oral Tablet      Allergies   Allergen Reactions   . Penicillins        OB History   Gravida Para Term Preterm AB Living   0 0 0 0 0 0   SAB IAB Ectopic Multiple Live Births   0 0 0 0 0  Social History     Substance and Sexual Activity   Sexual Activity Yes   . Partners: Male       She wears her seatbelt.  She gets calcium in her diet.  Performing self breast evaluations monthly.  The patient denies any abuse or violence and feels safe at home.    Review of Systems:  The patient denies any bowel or bladder symptoms.   normal menses, no abnormal bleeding, pelvic pain or discharge, no breast pain or new or enlarging lumps on self exam, no side effects of hormonal medications  Constitutional:  No unexplained weight gain or loss, no loss of appetite, no fever, no night sweats or chills.  Ears nose mouth and throat:  No difficulty with hearing, no sinus problems, no runny nose, no postnasal drip, no ringing ears, no mouth sores, loose teeth, ear pain, nose bleeds, sore throat, facial pain or numbness.  Cardiovascular:  No irregular heart beats, racing heartbeat, chest pains, swelling of feet or legs, no pain in legs with walking.  Respiratory:  No shortness of breath, no night sweats, no prolonged cough, wheezing, sputum production, no hemoptysis.  Gastrointestinal:  No heartburn, constipation, intolerance to foods, diarrhea abdominal pain, difficulty swallowing, nausea, vomiting, and no change in bowel movements.  Integument:  No rash, itching, skin lesions, change in hair.  Neurologic:  No frequent headaches, double  vision, weakness, changes in station, problems with walking or balance, dizziness, tremor, loss of consciousness no visual loss.  Psychiatric:  No insomnia, irritability, depression, anxiety, mood swings, hallucinations.  Endocrine no intolerance to heat and cold, no frequent hunger, urination, thirst.  No changes sex drive.  Hematologic:  No easy bleeding, easy bruising, anemia, abnormal swollen areas.  Allergy/immunology:  No seasonal allergies, hay fever, itching, frequent infections, no exposure to HIV.    Physical Exam:  BP (!) 162/86   Pulse 80   Ht 1.676 m (5\' 6" )   Wt 76.7 kg (169 lb)   Breastfeeding No   BMI 27.28 kg/m        General: No acute distress.  Skin: Intact.  No visible rashes, no jaundice.  HEENT: Normocephalic, Atraumatic, No obvious lesions.  Neck/Thyroid: No evidence of thyromegaly or lymphadenopathy.  Respiratory: Lungs clear to auscultation bilaterally.  Cardiac: Regular rate and rhythm without murmur, rub, or gallop.  Abdomen: Soft, Non tender, Non distended without guarding or rebound.   Extremities: No cyanosis, clubbing, or edema.  Neurologic: Orientation appropriate. No gross motor abnormalities or deficits noted.  Psychiatric: Mood stable.    Breasts:  No masses or nodes  Genitourinary:   External Exam: External exam normal female genitalia.  No urethral tenderness.  No genital lesions present.    Vaginal Exam:  Vagina normal to exam. Moist and pink vaginal mucosa,  abnormal discharge.   Bladder:  Bladder is normal to exam. Suprapubic fullness not present.    Uterus: Uterus is normal to exam. Cervix is normal to exam. No cervical discharge and no cervical motion tenderness.   Position:  Normal   Contour:  Regular.    Mobility:  Mobile.    Adnexa:  No palpable masses and no tenderness bilaterally.    Rectovaginal exam:   Not done     Assessment/Plan:   Routine gynecological exam with cervical pap smear.    ENCOUNTER DIAGNOSES     ICD-10-CM   1. Encounter for well woman exam with  routine gynecological exam  Z01.419   2. Hormone replacement therapy  D57.897        Counseling/Education:  Reviewed ASCCP guidelines for cervical cancer screening.  Reviewed breast self exams and breast awareness.    A lipid panel should be followed with her PCP.    A mammogram will be ordered yearly.  The patient was counseled on diet, exercise, calcium in her diet, and avoidance of tobacco products.  Colonoscopy guidelines discussed and immunizations.    Orders Placed This Encounter   . progesterone micronized (PROMETRIUM) 100 mg Oral Capsule   . estradioL (ESTRACE) 1 mg Oral Tablet   . THIN PREP PAP REFLEX TO HPV MRNA IF ASCUS ([facility])         Remonia Richter, DO  07/09/2022, 18:30

## 2022-07-22 LAB — THIN PREP PAP REFLEX TO HPV MRNA IF ASCUS (QUEST)

## 2023-01-05 IMAGING — MG 3D SCREENING MAMMO BIL AND TOMO
5 series · 9 of 24 positions shown · non-contrast
Comparison: Previous examinations dated 10/25/2023, 10/18/2022, 08/02/2021.

------------- REPORT [id number] -------------
[facility]
[address]
We wish to report the following on your recent mammography examination. We are sending a report to your referring physician or other health care provider.
INDICATION: Asymptomatic 56-year-old with no family history.  Lifetime breast cancer risk 8.1%.

[R]
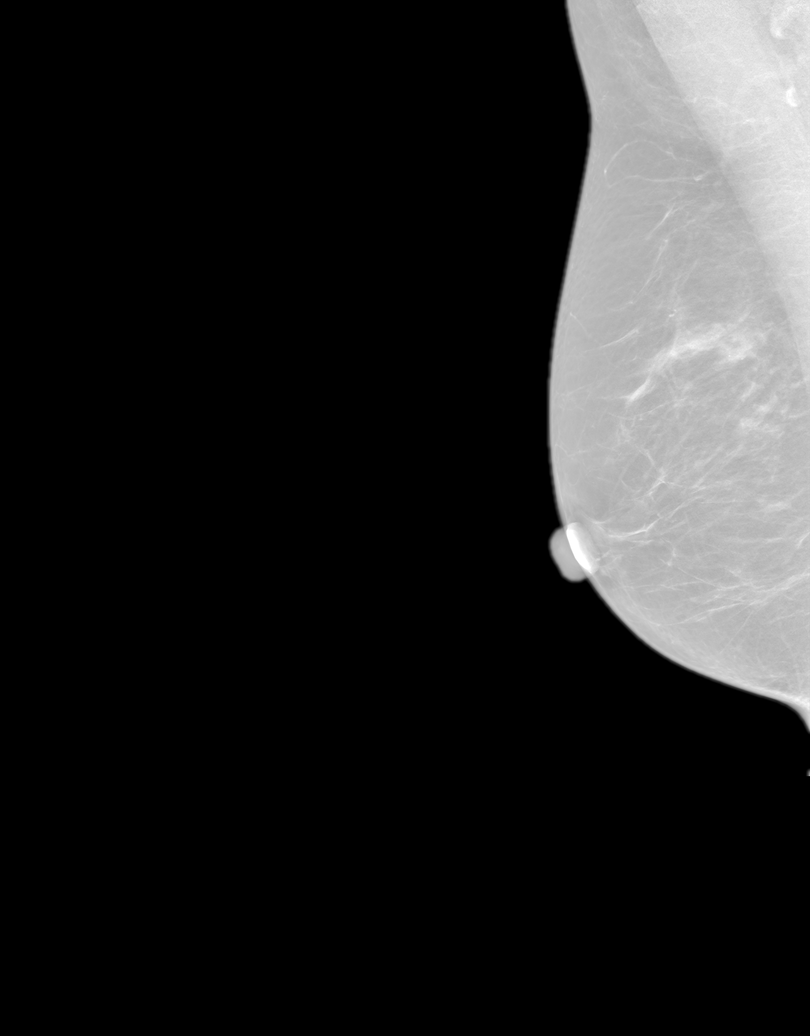

[L]
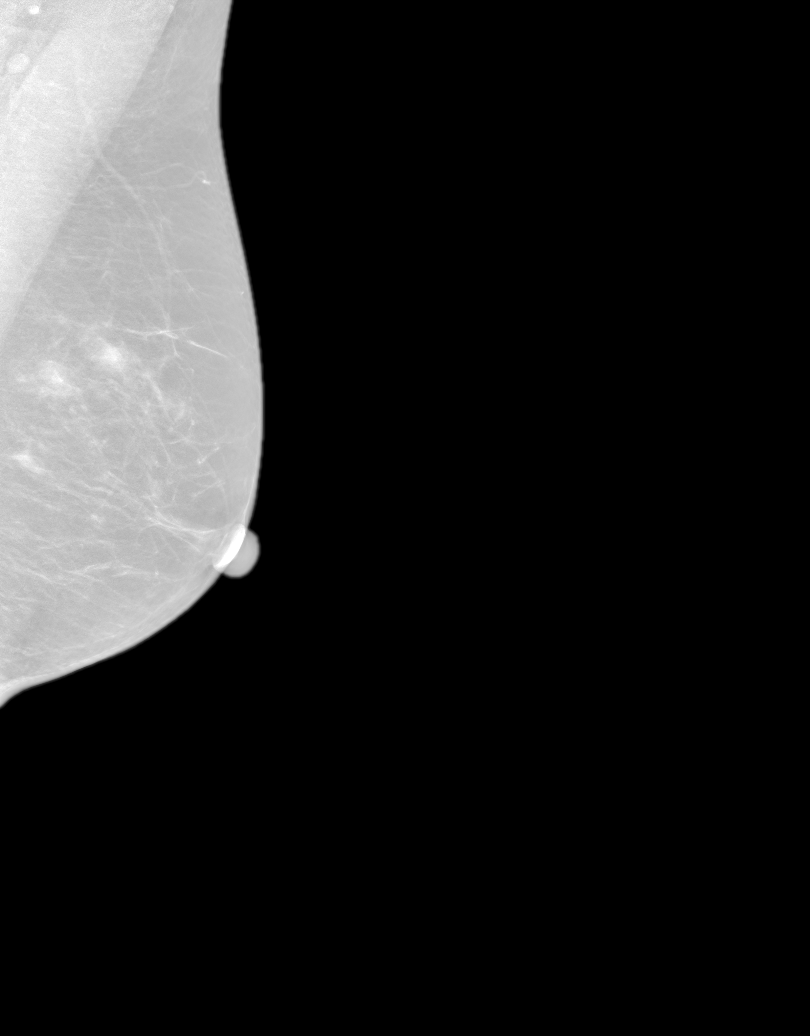

[R CC tomo · right · 0.10mm/px · 2 of 2 slices shown]
[im 1/2]
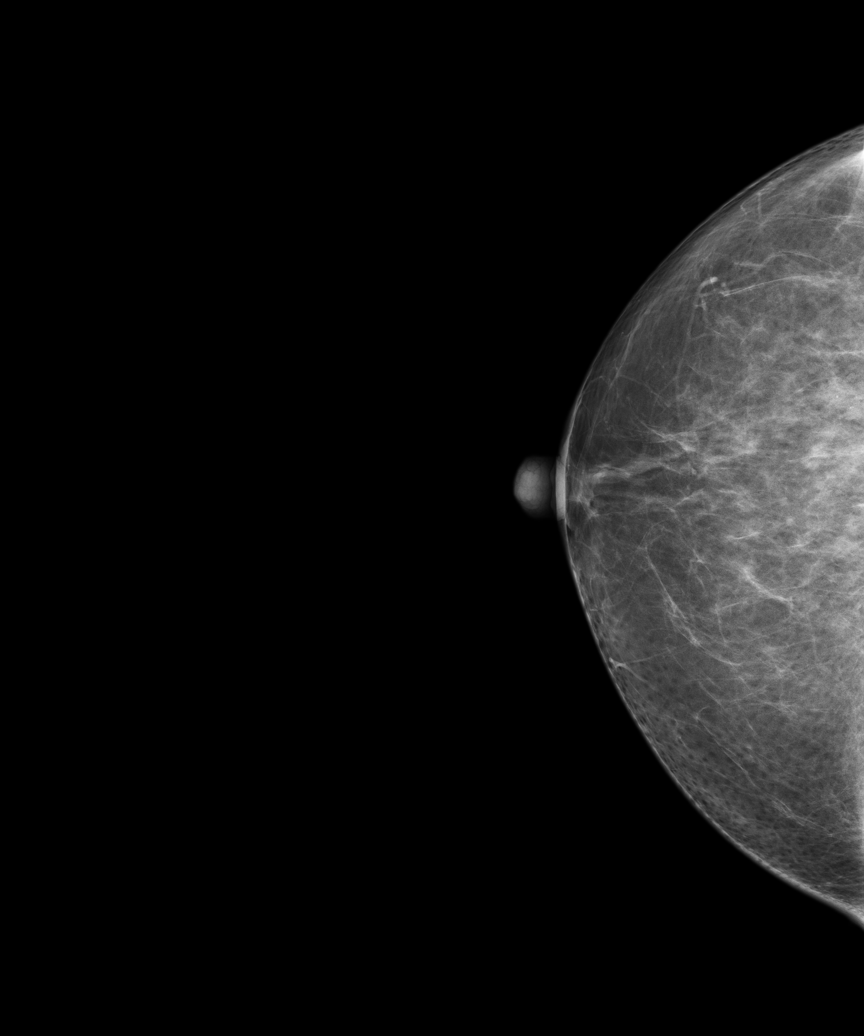
[im 2/2]
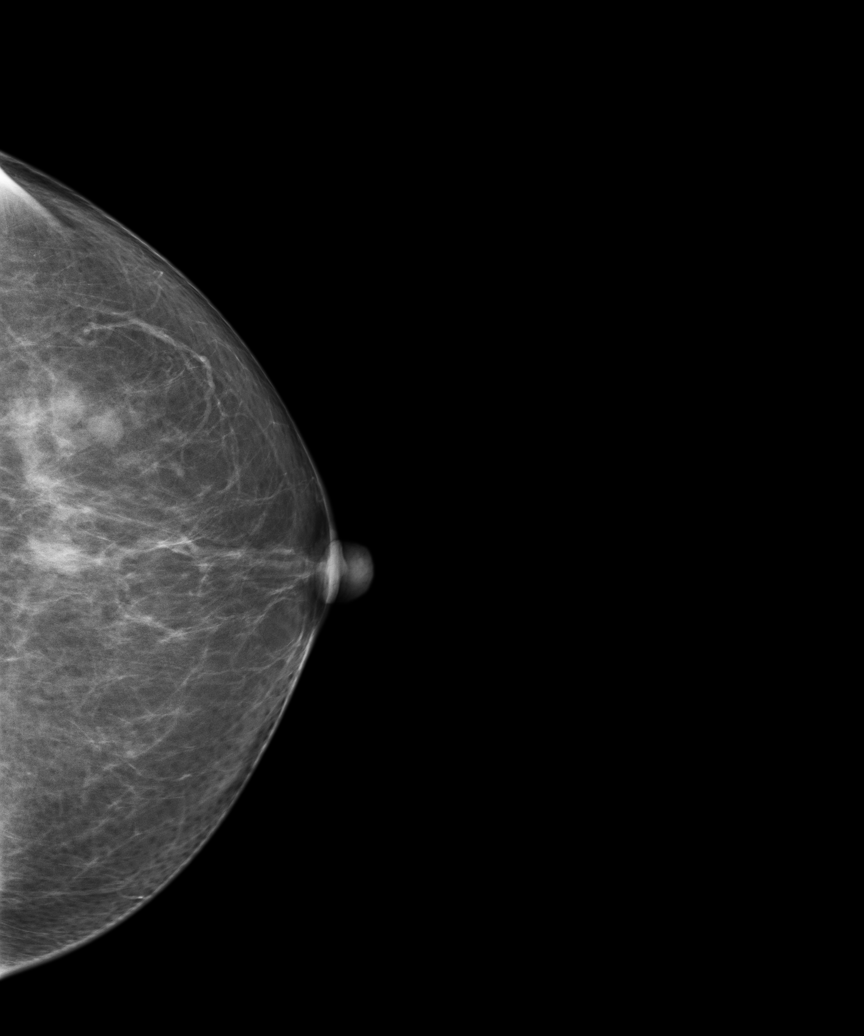

[3D SCREENING MAMMO BIL AND TOMO tomo · 2 acquisitions, 4 frames shown (1 of 2)]
[im 1/2]
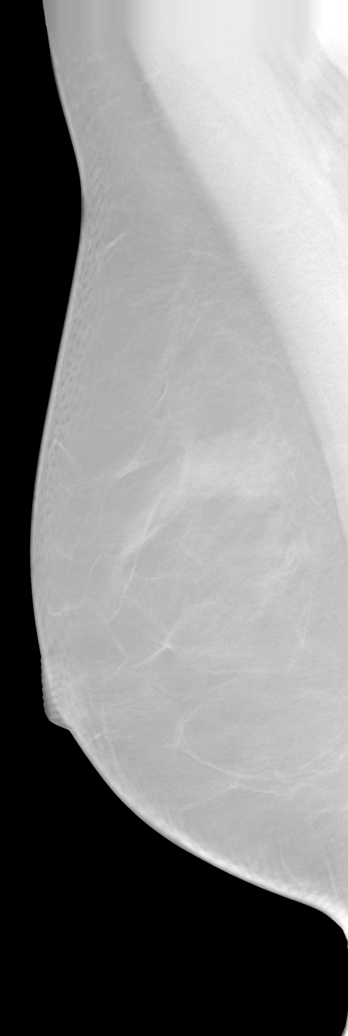
[im 1/2]
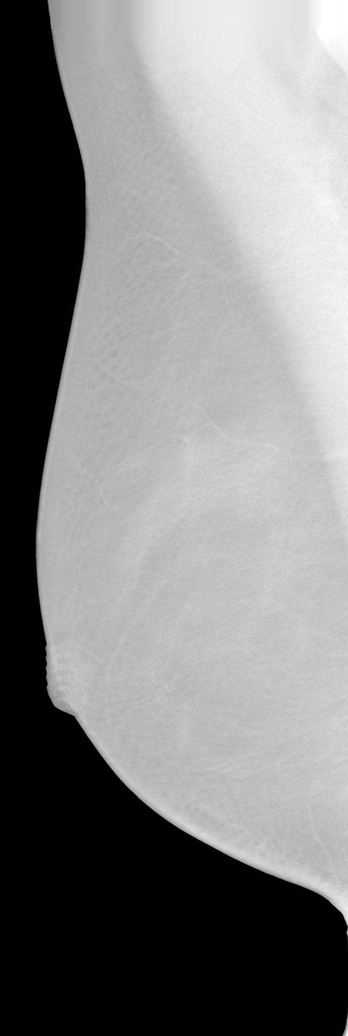
[im 2/2]
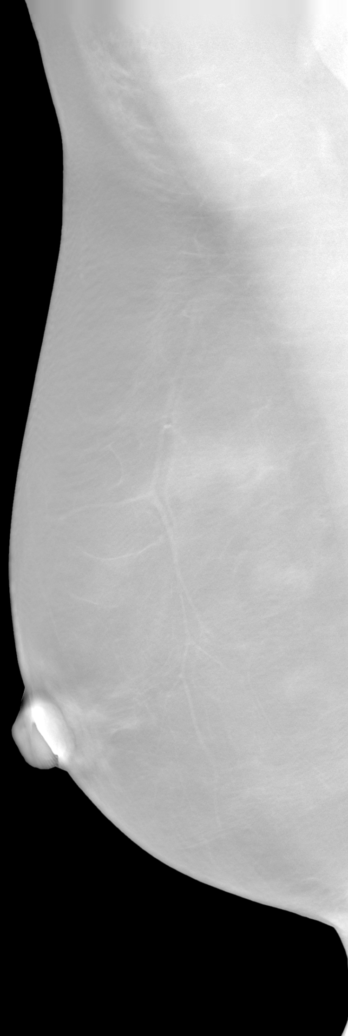
[im 2/2]
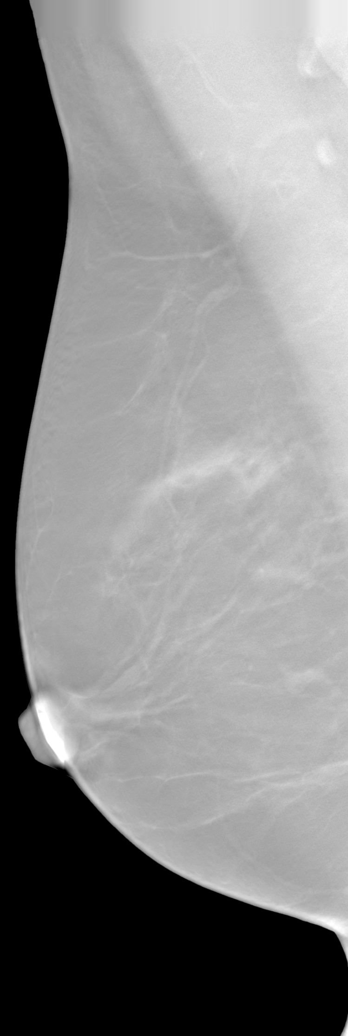

[3D SCREENING MAMMO BIL AND TOMO tomo (2 of 2) · tomo slice 12/75.0]
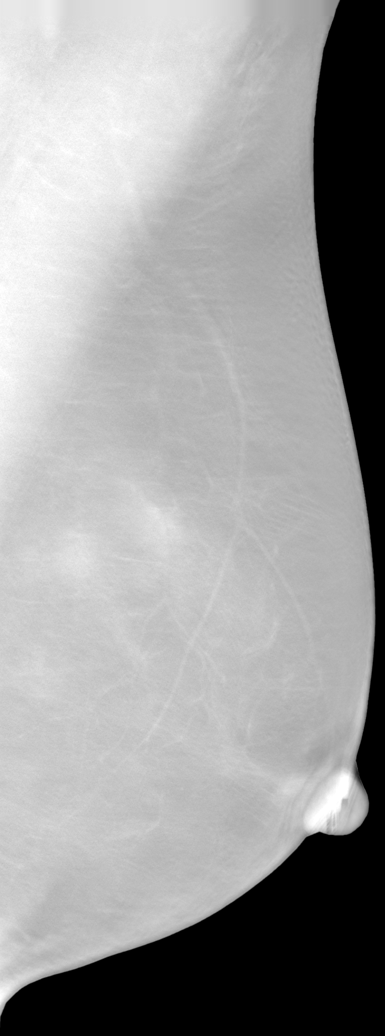

[9 of 24 positions shown; findings below may reference images not displayed]

FINDING: Normal/Negative-No evidence of cancer.

This statement is mandated by the [place], Department of Health.
Your examination was performed by one of our technologists, who are registered radiological technologists and also specially certified in mammography:
___
Tawanda, Kangeta (M)

Your mammogram was interpreted by our radiologist.

( 
Kuninori Sisido, M.D.

(Annual Breast Examination by a physician or other health care provider
(Annual Mammography Screening beginning at age 40
(Monthly Breast Self Examination

------------- REPORT [id number] -------------
﻿

EXAM:  3D SCREENING MAMMO BIL AND TOMO
FINDINGS: Focal asymmetry in the upper lateral breast on both sides is stable.  No new mass, architectural change or abnormal calcifications are seen. Benign lymph nodes in the axillae with fatty hila are stable.
IMPRESSION: 1.  BIRADS 2-Benign findings. Patient has been added in a reminder system with a target date for the next screening mammography.

2.  DENSITY CODE –  C (Heterogeneously dense) 

Final Assessment Code:

BI-RADS 0
 Need additional imaging evaluation.

BI-RADS 1
 Negative mammogram.

BI-RADS 2
 Benign finding.

BI-RADS 3
 Probably benign finding; short-interval follow-up suggested.

BI-RADS 4
 Suspicious abnormality; biopsy should be considered.

BI-RADS 5
 Highly suggestive of malignancy; appropriate action should be taken.

BI-RADS 6
 Known biopsy-proven malignancy; appropriate action should be taken.

NOTE:
In compliance with Federal regulations, the results of this mammogram are being sent to the patient.

## 2023-01-06 ENCOUNTER — Encounter (INDEPENDENT_AMBULATORY_CARE_PROVIDER_SITE_OTHER): Payer: Self-pay | Admitting: OBSTETRICS/GYNECOLOGY

## 2023-01-06 ENCOUNTER — Other Ambulatory Visit (INDEPENDENT_AMBULATORY_CARE_PROVIDER_SITE_OTHER): Payer: Self-pay

## 2023-07-09 ENCOUNTER — Other Ambulatory Visit (INDEPENDENT_AMBULATORY_CARE_PROVIDER_SITE_OTHER): Payer: Self-pay | Admitting: OBSTETRICS/GYNECOLOGY

## 2023-07-15 ENCOUNTER — Other Ambulatory Visit (INDEPENDENT_AMBULATORY_CARE_PROVIDER_SITE_OTHER): Payer: Self-pay | Admitting: OBSTETRICS/GYNECOLOGY

## 2023-08-08 ENCOUNTER — Other Ambulatory Visit (INDEPENDENT_AMBULATORY_CARE_PROVIDER_SITE_OTHER): Payer: Self-pay | Admitting: OBSTETRICS/GYNECOLOGY

## 2024-01-21 IMAGING — MR MRI LUMBAR SPINE WITHOUT CONTRAST
4 of 6 series · 31 of 48 positions shown · IV contrast (gadolinium)
Comparison: None available.

﻿EXAM:  [id number]   MRI LUMBAR SPINE WITHOUT CONTRAST
INDICATION: History of spinal stenosis. Persistent back pain with sciatica.  Worsening pain.  No history of back surgery or malignancy.
TECHNIQUE: Multiplanar, multisequential MRI of the lumbosacral spine was performed without gadolinium contrast.

[Series 5: T2 · sagittal · 5.0mm · 0.94mm/px · 5 of 13 slices shown (1 of 3)]
[im 1/13]
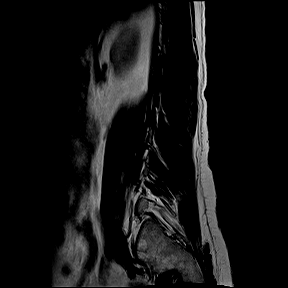
[im 4/13]
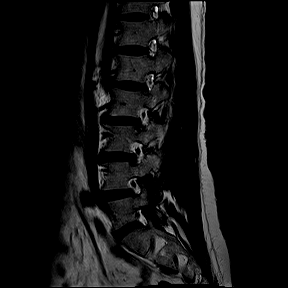
[im 7/13]
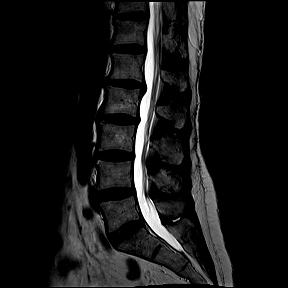
[im 10/13]
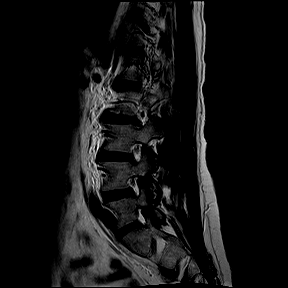
[im 13/13]
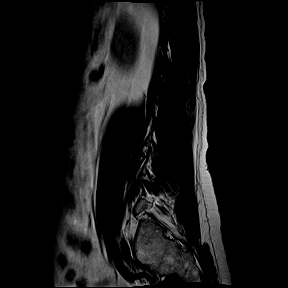

[Series 6: T1 · sagittal · 5.0mm · 0.94mm/px · 6 of 13 slices shown]
[im 1/13]
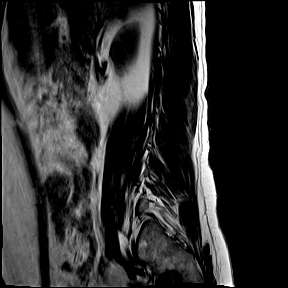
[im 3/13]
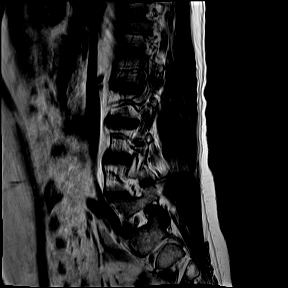
[im 5/13]
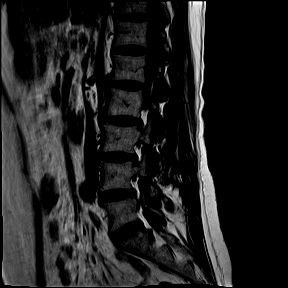
[im 8/13]
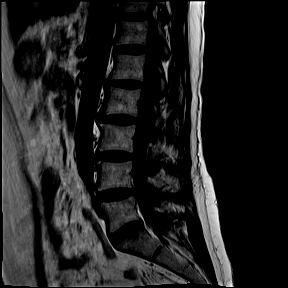
[im 10/13]
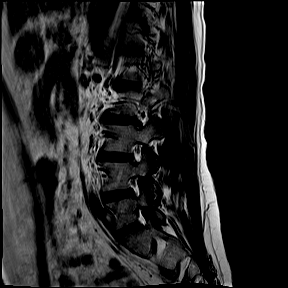
[im 13/13]
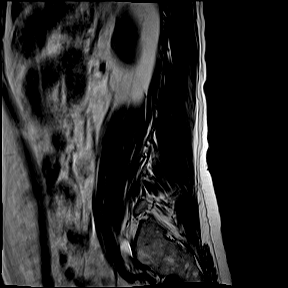

[Series 8: T2 · coronal · 5.0mm · 0.82mm/px · 9 of 20 slices shown (2 of 3)]
[im 1/20]
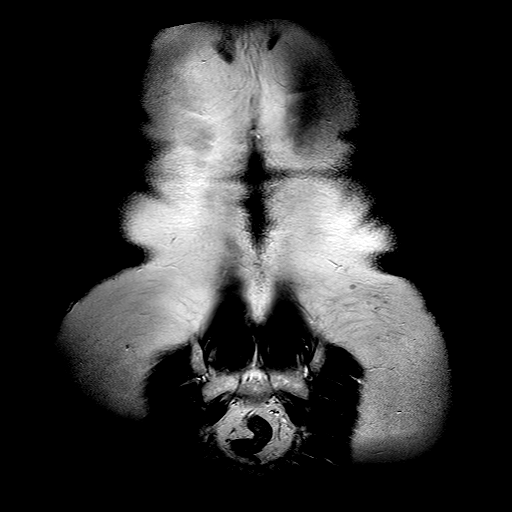
[im 3/20]
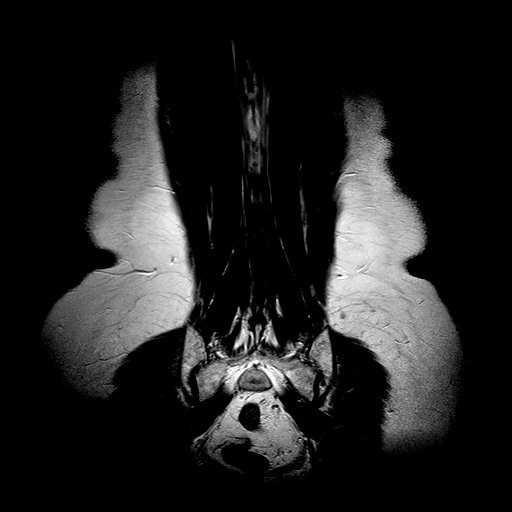
[im 5/20]
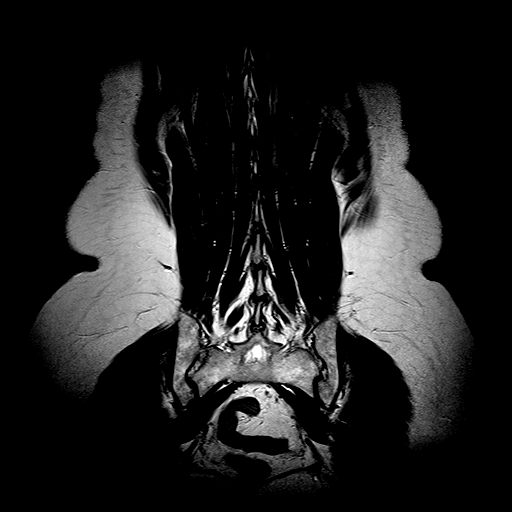
[im 8/20]
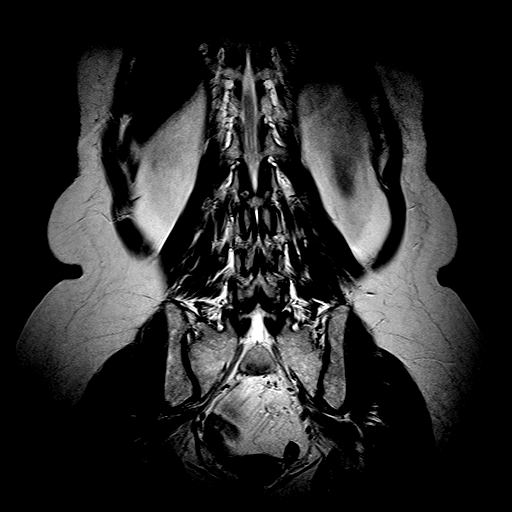
[im 10/20]
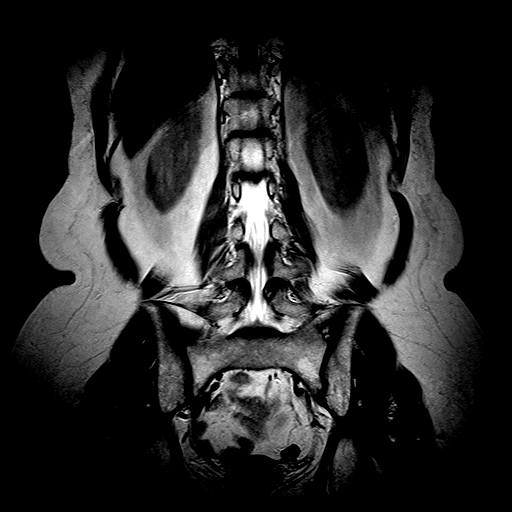
[im 12/20]
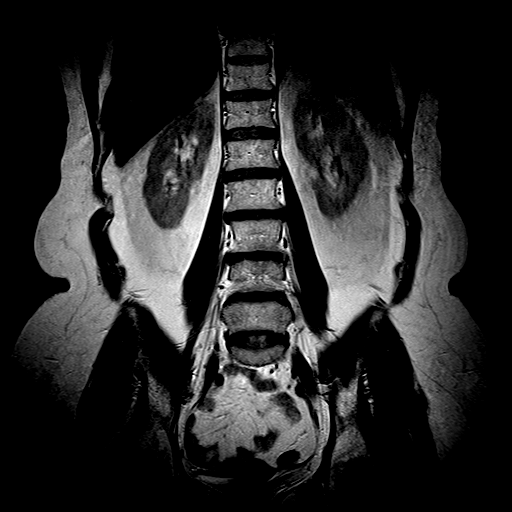
[im 15/20]
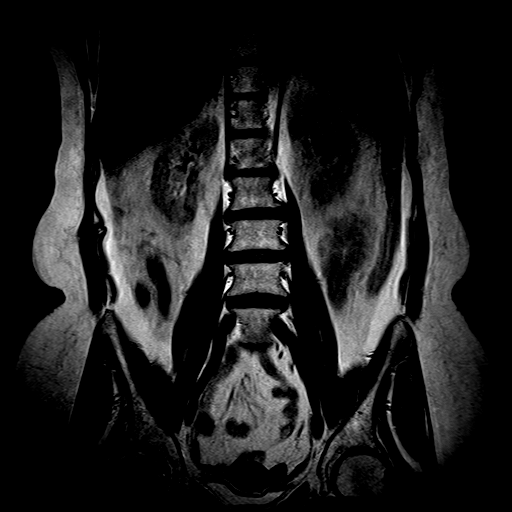
[im 17/20]
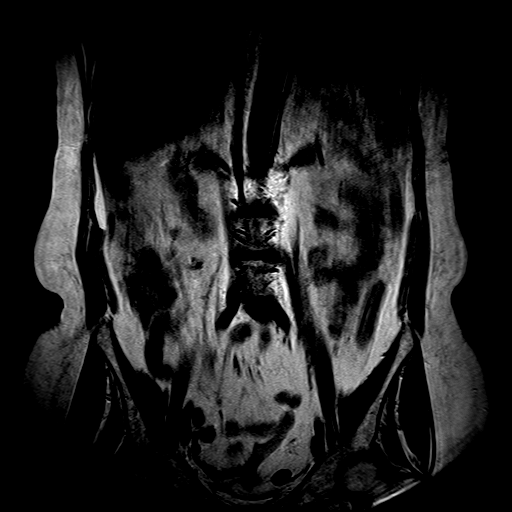
[im 20/20]
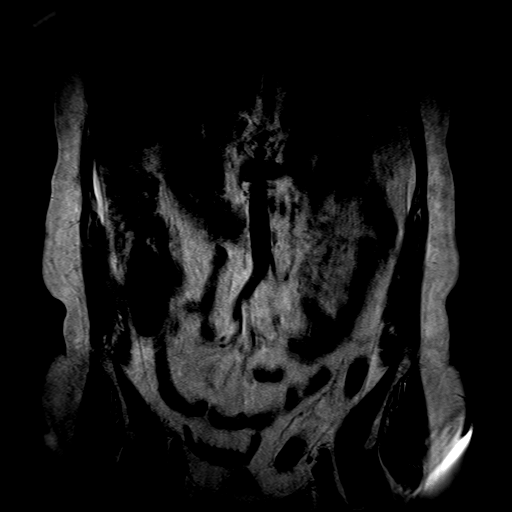

[Series 9: T2 · axial · 4.0mm · 0.52mm/px · z∈[-134,+85]mm · 11 of 24 slices shown (3 of 3)]
[im 1/24]
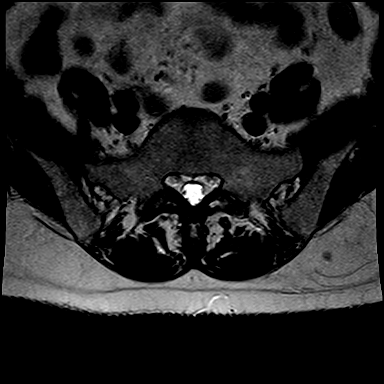
[im 3/24]
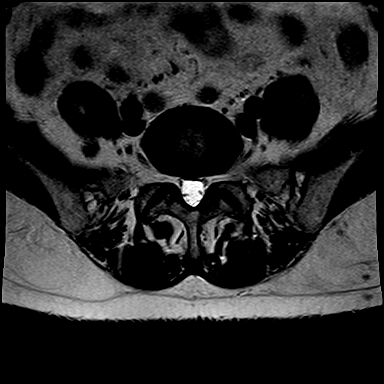
[im 5/24]
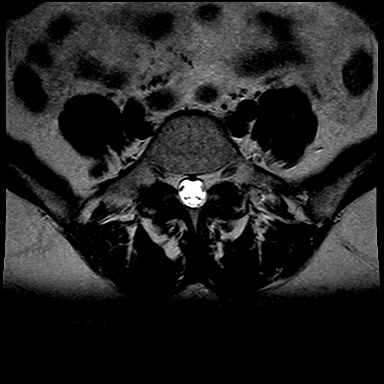
[im 7/24]
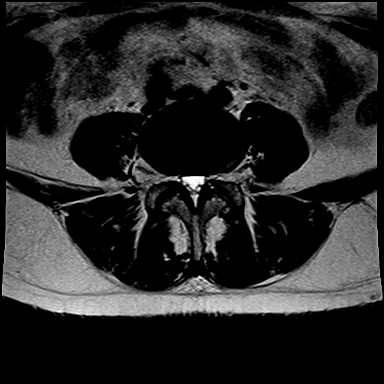
[im 10/24]
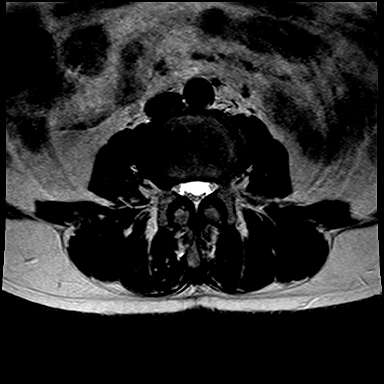
[im 12/24]
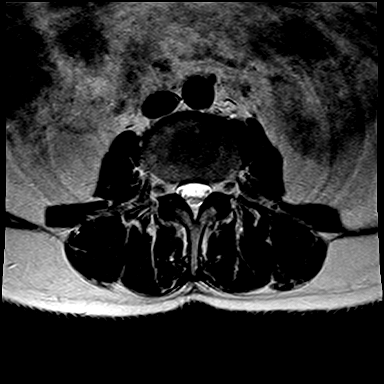
[im 14/24]
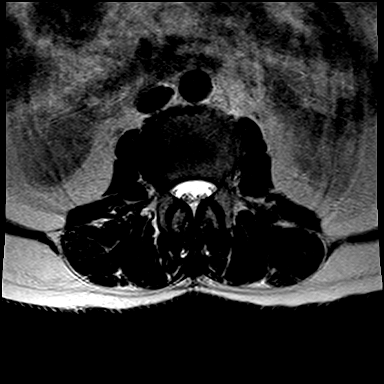
[im 17/24]
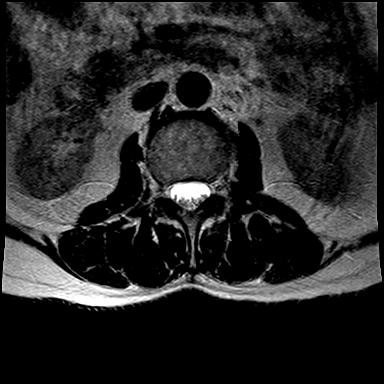
[im 19/24]
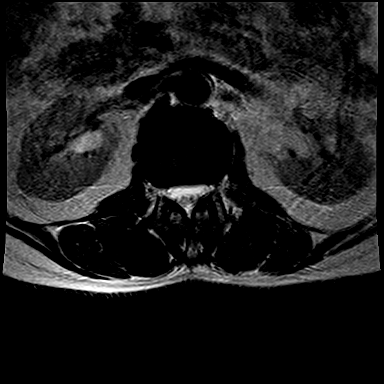
[im 21/24]
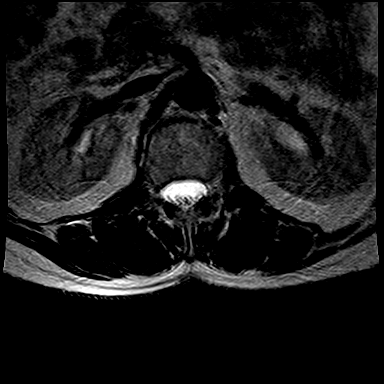
[im 24/24]
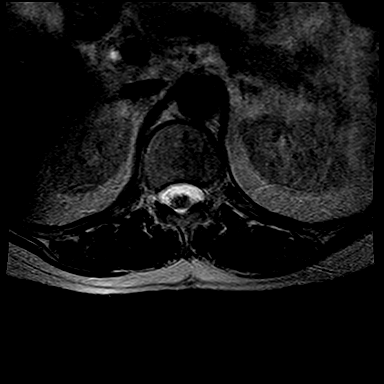

[31 of 48 positions shown; findings below may reference images not displayed]

FINDINGS: No acute bony lesions of lumbar vertebrae.  The conus terminates at T12-L1 level. 

At L1-2 level, degenerative disc disease with bulging annulus with small central disc protrusion mildly impinging on thecal sac in the midline.  Bulging annulus is causing mild biforaminal narrowing.

At L2-3 level, degenerative disc disease and facet arthropathy are causing mild compromise of thecal sac and both lateral recess.  AP diameter of thecal sac in the midline measures 11 mm. 

At L3-4 level, degenerative disc disease and facet arthropathy are causing mild compromise of thecal sac and both lateral recess. AP diameter of thecal sac in the midline measures 9.5 mm. 

At L4-5 level, significant bilateral facet arthropathy with hypertrophy of dorsal ligaments is noted along with degenerative disc disease causing moderate compromise of both lateral recess, left worse than the right. Mild compromise of thecal sac with AP diameter in the midline measuring 9 mm. 

At L5-S1 level, degenerative disc disease and facet arthropathy causing mild biforaminal narrowing. 

Paravertebral soft tissues are unremarkable.
IMPRESSION: 1. No acute or focal bone changes of lumbar vertebrae.

2. Multilevel degenerative changes as described above in detail at each level.
